# Patient Record
Sex: Female | Born: 1940 | Race: White | Hispanic: No | Marital: Married | Smoking: Never smoker
Health system: Southern US, Community
[De-identification: ages and names within clinical notes are randomized; demographics above are authoritative.]

## PROBLEM LIST (undated history)

## (undated) DIAGNOSIS — K439 Ventral hernia without obstruction or gangrene: Secondary | ICD-10-CM

## (undated) DIAGNOSIS — C801 Malignant (primary) neoplasm, unspecified: Secondary | ICD-10-CM

## (undated) DIAGNOSIS — M1990 Unspecified osteoarthritis, unspecified site: Secondary | ICD-10-CM

## (undated) HISTORY — PX: BREAST LUMPECTOMY: SHX2

## (undated) HISTORY — DX: Ventral hernia without obstruction or gangrene: K43.9

---
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
## 1971-08-31 HISTORY — PX: BREAST BIOPSY: SHX20

## 1992-06-01 HISTORY — PX: ABDOMINAL HYSTERECTOMY: SHX81

## 2011-02-27 DIAGNOSIS — Z8719 Personal history of other diseases of the digestive system: Secondary | ICD-10-CM | POA: Insufficient documentation

## 2012-04-19 ENCOUNTER — Ambulatory Visit: Payer: Self-pay | Admitting: Family Medicine

## 2013-04-20 ENCOUNTER — Ambulatory Visit: Payer: Self-pay | Admitting: Family Medicine

## 2013-12-26 DIAGNOSIS — M189 Osteoarthritis of first carpometacarpal joint, unspecified: Secondary | ICD-10-CM | POA: Insufficient documentation

## 2014-04-24 ENCOUNTER — Ambulatory Visit: Payer: Self-pay | Admitting: Family Medicine

## 2014-06-01 HISTORY — PX: TOTAL KNEE ARTHROPLASTY: SHX125

## 2014-11-22 DIAGNOSIS — R29810 Facial weakness: Secondary | ICD-10-CM | POA: Insufficient documentation

## 2014-12-28 DIAGNOSIS — Z96651 Presence of right artificial knee joint: Secondary | ICD-10-CM | POA: Insufficient documentation

## 2015-05-01 ENCOUNTER — Other Ambulatory Visit: Payer: Self-pay | Admitting: Family Medicine

## 2015-05-01 DIAGNOSIS — Z1231 Encounter for screening mammogram for malignant neoplasm of breast: Secondary | ICD-10-CM

## 2015-05-03 DIAGNOSIS — R7303 Prediabetes: Secondary | ICD-10-CM | POA: Insufficient documentation

## 2015-05-03 DIAGNOSIS — R739 Hyperglycemia, unspecified: Secondary | ICD-10-CM | POA: Insufficient documentation

## 2015-05-08 ENCOUNTER — Ambulatory Visit
Admission: RE | Admit: 2015-05-08 | Discharge: 2015-05-08 | Disposition: A | Discharge status: Home / Self Care | Payer: PPO | Source: Ambulatory Visit | Attending: Family Medicine | Admitting: Family Medicine

## 2015-05-08 DIAGNOSIS — Z1231 Encounter for screening mammogram for malignant neoplasm of breast: Secondary | ICD-10-CM | POA: Diagnosis present

## 2015-07-04 DIAGNOSIS — M189 Osteoarthritis of first carpometacarpal joint, unspecified: Secondary | ICD-10-CM | POA: Diagnosis not present

## 2015-07-04 DIAGNOSIS — Z1211 Encounter for screening for malignant neoplasm of colon: Secondary | ICD-10-CM | POA: Diagnosis not present

## 2015-07-04 DIAGNOSIS — R1310 Dysphagia, unspecified: Secondary | ICD-10-CM | POA: Diagnosis not present

## 2015-07-18 DIAGNOSIS — R1310 Dysphagia, unspecified: Secondary | ICD-10-CM | POA: Diagnosis not present

## 2015-07-18 DIAGNOSIS — K219 Gastro-esophageal reflux disease without esophagitis: Secondary | ICD-10-CM | POA: Diagnosis not present

## 2015-07-18 DIAGNOSIS — K449 Diaphragmatic hernia without obstruction or gangrene: Secondary | ICD-10-CM | POA: Diagnosis not present

## 2015-07-23 DIAGNOSIS — Z1283 Encounter for screening for malignant neoplasm of skin: Secondary | ICD-10-CM | POA: Diagnosis not present

## 2015-07-23 DIAGNOSIS — Z872 Personal history of diseases of the skin and subcutaneous tissue: Secondary | ICD-10-CM | POA: Diagnosis not present

## 2015-07-23 DIAGNOSIS — L298 Other pruritus: Secondary | ICD-10-CM | POA: Diagnosis not present

## 2015-07-23 DIAGNOSIS — B351 Tinea unguium: Secondary | ICD-10-CM | POA: Diagnosis not present

## 2015-07-23 DIAGNOSIS — D1722 Benign lipomatous neoplasm of skin and subcutaneous tissue of left arm: Secondary | ICD-10-CM | POA: Diagnosis not present

## 2015-07-23 DIAGNOSIS — L918 Other hypertrophic disorders of the skin: Secondary | ICD-10-CM | POA: Diagnosis not present

## 2015-07-23 DIAGNOSIS — L538 Other specified erythematous conditions: Secondary | ICD-10-CM | POA: Diagnosis not present

## 2015-07-23 DIAGNOSIS — R208 Other disturbances of skin sensation: Secondary | ICD-10-CM | POA: Diagnosis not present

## 2015-07-23 DIAGNOSIS — L578 Other skin changes due to chronic exposure to nonionizing radiation: Secondary | ICD-10-CM | POA: Diagnosis not present

## 2015-07-23 DIAGNOSIS — Z789 Other specified health status: Secondary | ICD-10-CM | POA: Diagnosis not present

## 2015-09-30 HISTORY — PX: COLONOSCOPY W/ BIOPSIES: SHX1374

## 2015-10-02 DIAGNOSIS — K640 First degree hemorrhoids: Secondary | ICD-10-CM | POA: Diagnosis not present

## 2015-10-02 DIAGNOSIS — K635 Polyp of colon: Secondary | ICD-10-CM | POA: Diagnosis not present

## 2015-10-02 DIAGNOSIS — Z980 Intestinal bypass and anastomosis status: Secondary | ICD-10-CM | POA: Diagnosis not present

## 2015-10-02 DIAGNOSIS — Z1211 Encounter for screening for malignant neoplasm of colon: Secondary | ICD-10-CM | POA: Diagnosis not present

## 2015-10-02 DIAGNOSIS — D127 Benign neoplasm of rectosigmoid junction: Secondary | ICD-10-CM | POA: Diagnosis not present

## 2015-11-22 DIAGNOSIS — G8929 Other chronic pain: Secondary | ICD-10-CM | POA: Diagnosis not present

## 2015-11-22 DIAGNOSIS — Z96652 Presence of left artificial knee joint: Secondary | ICD-10-CM | POA: Insufficient documentation

## 2015-11-22 DIAGNOSIS — Z96651 Presence of right artificial knee joint: Secondary | ICD-10-CM | POA: Diagnosis not present

## 2015-11-22 DIAGNOSIS — M25561 Pain in right knee: Secondary | ICD-10-CM | POA: Diagnosis not present

## 2015-11-22 DIAGNOSIS — Z09 Encounter for follow-up examination after completed treatment for conditions other than malignant neoplasm: Secondary | ICD-10-CM | POA: Diagnosis not present

## 2015-12-12 DIAGNOSIS — Z780 Asymptomatic menopausal state: Secondary | ICD-10-CM | POA: Diagnosis not present

## 2015-12-12 DIAGNOSIS — R739 Hyperglycemia, unspecified: Secondary | ICD-10-CM | POA: Diagnosis not present

## 2015-12-23 ENCOUNTER — Other Ambulatory Visit: Payer: Self-pay | Admitting: Family Medicine

## 2015-12-23 DIAGNOSIS — Z780 Asymptomatic menopausal state: Secondary | ICD-10-CM

## 2015-12-24 ENCOUNTER — Other Ambulatory Visit: Payer: Self-pay | Admitting: Family Medicine

## 2015-12-24 DIAGNOSIS — Z1231 Encounter for screening mammogram for malignant neoplasm of breast: Secondary | ICD-10-CM

## 2016-01-01 ENCOUNTER — Ambulatory Visit
Admission: RE | Admit: 2016-01-01 | Discharge: 2016-01-01 | Disposition: A | Discharge status: Home / Self Care | Payer: PPO | Source: Ambulatory Visit | Attending: Family Medicine | Admitting: Family Medicine

## 2016-01-01 DIAGNOSIS — Z780 Asymptomatic menopausal state: Secondary | ICD-10-CM | POA: Insufficient documentation

## 2016-01-01 DIAGNOSIS — M85852 Other specified disorders of bone density and structure, left thigh: Secondary | ICD-10-CM | POA: Diagnosis not present

## 2016-01-01 DIAGNOSIS — M8588 Other specified disorders of bone density and structure, other site: Secondary | ICD-10-CM | POA: Diagnosis not present

## 2016-02-10 DIAGNOSIS — Z23 Encounter for immunization: Secondary | ICD-10-CM | POA: Diagnosis not present

## 2016-04-17 ENCOUNTER — Ambulatory Visit
Admission: EM | Admit: 2016-04-17 | Discharge: 2016-04-17 | Disposition: A | Discharge status: Home / Self Care | Payer: PPO | Attending: Family Medicine | Admitting: Family Medicine

## 2016-04-17 ENCOUNTER — Ambulatory Visit (INDEPENDENT_AMBULATORY_CARE_PROVIDER_SITE_OTHER): Payer: PPO

## 2016-04-17 ENCOUNTER — Encounter: Payer: Self-pay | Admitting: Emergency Medicine

## 2016-04-17 DIAGNOSIS — W19XXXA Unspecified fall, initial encounter: Secondary | ICD-10-CM | POA: Diagnosis not present

## 2016-04-17 DIAGNOSIS — S40022A Contusion of left upper arm, initial encounter: Secondary | ICD-10-CM | POA: Diagnosis not present

## 2016-04-17 HISTORY — DX: Unspecified osteoarthritis, unspecified site: M19.90

## 2016-04-17 NOTE — ED Triage Notes (Signed)
Patient states that she fell and hit her left upper arm on the chest of drawers today.

## 2016-04-17 NOTE — ED Provider Notes (Signed)
CSN: [id number]     Arrival date & time 04/17/16  1429 History   None    Chief Complaint  Patient presents with  . Arm Pain    left  . Fall   (Consider location/radiation/quality/duration/timing/severity/associated sxs/prior Treatment) HPI  75 year old female presents with an injury to her left lateral upper arm after she tripped This morning and fell against the edge of the chest of drawers. Since that time she's had some pain over right Mid humerus with  obvious bruise and hematoma. She has had applied ice 20 minutes on 20 minutes off today.       Past Medical History:  Diagnosis Date  . Arthritis    Past Surgical History:  Procedure Laterality Date  . ABDOMINAL HYSTERECTOMY    . BREAST BIOPSY Left 08/31/1971   neg   Family History  Problem Relation Age of Onset  . Breast cancer Maternal Aunt 70  . CAD Mother   . Cancer Father    Social History  Substance Use Topics  . Smoking status: Never Smoker  . Smokeless tobacco: Never Used  . Alcohol use No   OB History    No data available     Review of Systems  Constitutional: Positive for activity change. Negative for appetite change, chills, fatigue and fever.  Musculoskeletal: Positive for myalgias.  All other systems reviewed and are negative.   Allergies  Patient has no known allergies.  Home Medications   Prior to Admission medications   Medication Sig Start Date End Date Taking? Authorizing Provider  celecoxib (CELEBREX) 50 MG capsule Take 100 mg by mouth 2 (two) times daily.   Yes Historical Provider, MD  Multiple Vitamin (MULTIVITAMIN) tablet Take 1 tablet by mouth daily.   Yes Historical Provider, MD  traMADol (ULTRAM) 50 MG tablet Take 50 mg by mouth every 6 (six) hours as needed.   Yes Historical Provider, MD   Meds Ordered and Administered this Visit  Medications - No data to display  BP (!) 149/71 (BP Location: Right Arm)   Pulse 68   Temp 97.5 F (36.4 C) (Tympanic)   Resp 16   Ht 5\' 5"   (1.651 m)   Wt 175 lb (79.4 kg)   SpO2 97%   BMI 29.12 kg/m  No data found.   Physical Exam  Constitutional: She is oriented to person, place, and time. She appears well-developed and well-nourished. No distress.  HENT:  Head: Normocephalic and atraumatic.  Eyes: EOM are normal. Pupils are equal, round, and reactive to light.  Neck: Normal range of motion. Neck supple.  Musculoskeletal: Normal range of motion. She exhibits edema and tenderness.  Examination of the left upper arm shows a bruising and hematoma over the mid portion. Denies present under the hematoma bruise and also some tenderness along the humerus particularly posterior and lateral. Elbow range of motion is full and comfortable.  Neurological: She is alert and oriented to person, place, and time.  Skin: Skin is warm and dry. She is not diaphoretic.  Psychiatric: She has a normal mood and affect. Her behavior is normal. Judgment and thought content normal.  Nursing note and vitals reviewed.   Urgent Care Course   Clinical Course     Procedures (including critical care time)  Labs Review Labs Reviewed - No data to display  Imaging Review Dg Humerus Left  Result Date: 04/17/2016 CLINICAL DATA:  Initial encounter for PT hit mid shaft of her left humerus against a cupboard today now with  bruising and pain. No hx of previous injury or trauma. EXAM: LEFT HUMERUS - 2+ VIEW COMPARISON:  None. FINDINGS: No acute fracture or dislocation. No focal osseous lesion. No definite soft tissue swelling. IMPRESSION: No acute osseous abnormality. Electronically Signed   By: Abigail Miyamoto M.D.   On: 04/17/2016 17:11     Visual Acuity Review  Right Eye Distance:   Left Eye Distance:   Bilateral Distance:    Right Eye Near:   Left Eye Near:    Bilateral Near:         MDM   1. Fall, initial encounter   2. Contusion of left arm, initial encounter    Discussed with the patient and her husband our findings today. Reviewed  the x-rays with them. Has a contusion of the mid humerus. I told her to expect bruising and actually some extravasation of the Bruce into her forearm and hand is possible. I have recommended that she use ice 20 minutes out of every 2 hours while awake. Otherwise she should heal without incident. If she has any problems she will follow-up with her primary care physician    Lorin Picket, PA-C 04/17/16 1748

## 2016-04-20 ENCOUNTER — Telehealth: Payer: Self-pay

## 2016-04-20 NOTE — Telephone Encounter (Signed)
Courtesy call back completed today after patient's visit at [facility]. Patient improved and will call back with any questions or concerns.  

## 2016-05-08 DIAGNOSIS — Z0000 Encounter for general adult medical examination without abnormal findings: Secondary | ICD-10-CM | POA: Diagnosis not present

## 2016-05-08 DIAGNOSIS — Z79899 Other long term (current) drug therapy: Secondary | ICD-10-CM | POA: Diagnosis not present

## 2016-05-08 DIAGNOSIS — R739 Hyperglycemia, unspecified: Secondary | ICD-10-CM | POA: Diagnosis not present

## 2016-05-11 ENCOUNTER — Ambulatory Visit
Admission: RE | Admit: 2016-05-11 | Discharge: 2016-05-11 | Disposition: A | Discharge status: Home / Self Care | Payer: PPO | Source: Ambulatory Visit | Attending: Family Medicine | Admitting: Family Medicine

## 2016-05-11 DIAGNOSIS — Z1231 Encounter for screening mammogram for malignant neoplasm of breast: Secondary | ICD-10-CM | POA: Diagnosis not present

## 2016-05-30 ENCOUNTER — Encounter: Payer: Self-pay | Admitting: Internal Medicine

## 2016-05-30 ENCOUNTER — Other Ambulatory Visit: Payer: Self-pay | Admitting: Internal Medicine

## 2016-06-05 ENCOUNTER — Ambulatory Visit: Payer: Self-pay | Admitting: Internal Medicine

## 2016-06-26 DIAGNOSIS — H2513 Age-related nuclear cataract, bilateral: Secondary | ICD-10-CM | POA: Diagnosis not present

## 2016-08-12 ENCOUNTER — Encounter: Payer: Self-pay | Admitting: Family Medicine

## 2016-08-12 ENCOUNTER — Ambulatory Visit: Payer: Self-pay | Admitting: Family Medicine

## 2016-08-12 ENCOUNTER — Ambulatory Visit (INDEPENDENT_AMBULATORY_CARE_PROVIDER_SITE_OTHER): Payer: PPO | Admitting: Family Medicine

## 2016-08-12 VITALS — BP 117/76 | HR 96 | Resp 16 | Ht 65.0 in | Wt 173.0 lb

## 2016-08-12 DIAGNOSIS — E663 Overweight: Secondary | ICD-10-CM

## 2016-08-12 DIAGNOSIS — N6019 Diffuse cystic mastopathy of unspecified breast: Secondary | ICD-10-CM

## 2016-08-12 DIAGNOSIS — K449 Diaphragmatic hernia without obstruction or gangrene: Secondary | ICD-10-CM

## 2016-08-12 DIAGNOSIS — M8580 Other specified disorders of bone density and structure, unspecified site: Secondary | ICD-10-CM

## 2016-08-12 DIAGNOSIS — E559 Vitamin D deficiency, unspecified: Secondary | ICD-10-CM | POA: Diagnosis not present

## 2016-08-12 DIAGNOSIS — Z87898 Personal history of other specified conditions: Secondary | ICD-10-CM | POA: Insufficient documentation

## 2016-08-12 DIAGNOSIS — R739 Hyperglycemia, unspecified: Secondary | ICD-10-CM | POA: Diagnosis not present

## 2016-08-12 DIAGNOSIS — E538 Deficiency of other specified B group vitamins: Secondary | ICD-10-CM

## 2016-08-12 DIAGNOSIS — L814 Other melanin hyperpigmentation: Secondary | ICD-10-CM | POA: Diagnosis not present

## 2016-08-12 DIAGNOSIS — Z8719 Personal history of other diseases of the digestive system: Secondary | ICD-10-CM | POA: Diagnosis not present

## 2016-08-12 DIAGNOSIS — M180 Bilateral primary osteoarthritis of first carpometacarpal joints: Secondary | ICD-10-CM

## 2016-08-12 DIAGNOSIS — L218 Other seborrheic dermatitis: Secondary | ICD-10-CM | POA: Diagnosis not present

## 2016-08-12 MED ORDER — ACETAMINOPHEN 500 MG PO TABS: 1000.0000 mg | ORAL_TABLET | Freq: Two times a day (BID) | ORAL | 0 refills | Status: AC

## 2016-08-12 NOTE — Patient Instructions (Addendum)
Stop celecoxib, begin Tylenol ES 2 tablets twice daily.

## 2016-08-13 ENCOUNTER — Other Ambulatory Visit: Payer: Self-pay | Admitting: Family Medicine

## 2016-08-13 DIAGNOSIS — M8580 Other specified disorders of bone density and structure, unspecified site: Secondary | ICD-10-CM | POA: Insufficient documentation

## 2016-08-13 DIAGNOSIS — K449 Diaphragmatic hernia without obstruction or gangrene: Secondary | ICD-10-CM | POA: Insufficient documentation

## 2016-08-13 DIAGNOSIS — N6019 Diffuse cystic mastopathy of unspecified breast: Secondary | ICD-10-CM | POA: Insufficient documentation

## 2016-08-13 LAB — CBC
HEMOGLOBIN: 13.5 g/dL (ref 11.1–15.9)
Hematocrit: 41.1 % (ref 34.0–46.6)
MCH: 30.3 pg (ref 26.6–33.0)
MCHC: 32.8 g/dL (ref 31.5–35.7)
MCV: 92 fL (ref 79–97)
Platelets: 332 10*3/uL (ref 150–379)
RBC: 4.45 x10E6/uL (ref 3.77–5.28)
RDW: 14.4 % (ref 12.3–15.4)
WBC: 8.9 10*3/uL (ref 3.4–10.8)

## 2016-08-13 LAB — COMPREHENSIVE METABOLIC PANEL
ALT: 27 IU/L (ref 0–32)
AST: 28 IU/L (ref 0–40)
Albumin/Globulin Ratio: 1.8 (ref 1.2–2.2)
Albumin: 4.6 g/dL (ref 3.5–4.8)
Alkaline Phosphatase: 72 IU/L (ref 39–117)
BUN/Creatinine Ratio: 22 (ref 12–28)
BUN: 18 mg/dL (ref 8–27)
Bilirubin Total: 0.3 mg/dL (ref 0.0–1.2)
CALCIUM: 10 mg/dL (ref 8.7–10.3)
CO2: 25 mmol/L (ref 18–29)
CREATININE: 0.82 mg/dL (ref 0.57–1.00)
Chloride: 100 mmol/L (ref 96–106)
GFR calc Af Amer: 82 mL/min/{1.73_m2} (ref >59)
GFR, EST NON AFRICAN AMERICAN: 71 mL/min/{1.73_m2} (ref >59)
Globulin, Total: 2.5 g/dL (ref 1.5–4.5)
Glucose: 100 mg/dL — ABNORMAL HIGH (ref 65–99)
Potassium: 4.8 mmol/L (ref 3.5–5.2)
Sodium: 141 mmol/L (ref 134–144)
Total Protein: 7.1 g/dL (ref 6.0–8.5)

## 2016-08-13 LAB — HEMOGLOBIN A1C
Est. average glucose Bld gHb Est-mCnc: 111 mg/dL
Hgb A1c MFr Bld: 5.5 % (ref 4.8–5.6)

## 2016-08-13 LAB — TSH: TSH: 1.39 u[IU]/mL (ref 0.450–4.500)

## 2016-08-13 LAB — VITAMIN B12

## 2016-08-13 LAB — VITAMIN D 25 HYDROXY (VIT D DEFICIENCY, FRACTURES): VIT D 25 HYDROXY: 31.3 ng/mL (ref 30.0–100.0)

## 2016-08-13 NOTE — Progress Notes (Signed)
Date:  08/12/2016   Name:  Felicia Romero   DOB:  1940/10/03   MRN:  [id number]  PCP:  Adline Potter, MD    Chief Complaint: Establish Care and Sciatica (Right side sciatica like pain )   History of Present Illness:  This is a 76 y.o. female seen for initial visit. Hx OA s/p B TKR, c/o B basal thumb pain improved with Voltaren gel. On Celebrex since before TKR, takes qd-bid, has not tried to stop. Hx osteopenia on DEXA 12/2015. Hx diverticulitis and gastric ulcer, colonoscopy 09/2015 normal. Barium swallow 07/2015 showed HH, gets occ dysphagia, not interested in surgery. Weight stable, saw optho 3 months ago, told prediabetic in past. Takes multiple vits/supps not sure she needs them all. Takes amox prn dental procedures. Father 45 with dementia, prostate ca, mother died 15 CVA, brother with psoriatic arthritis. Mammo 05/2016 ok. Imms UTD, had zoster imm in 2013.  Review of Systems:  Review of Systems  Constitutional: Negative for chills, fever and unexpected weight change.  HENT: Negative for ear pain and sore throat.   Eyes: Negative for pain.  Respiratory: Negative for cough and shortness of breath.   Cardiovascular: Negative for chest pain and leg swelling.  Gastrointestinal: Negative for abdominal pain.  Endocrine: Negative for polydipsia and polyuria.  Genitourinary: Negative for dysuria.  Neurological: Negative for tremors, syncope and light-headedness.    Patient Active Problem List   Diagnosis Date Noted  . Osteopenia 08/13/2016  . Hiatal hernia 08/13/2016  . Fibrocystic breast disease 08/13/2016  . History of prediabetes 08/12/2016  . Hx of diverticulitis of colon 08/12/2016  . Vitamin D deficiency 08/12/2016  . B12 deficiency 08/12/2016  . 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 Hyperglycemia, unspecified 05/03/2015  . Presence of right artificial knee joint 12/28/2014  . Facial droop 11/22/2014  . CMC arthritis, thumb, 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   . History of GI bleed 02/27/2011    Prior to Admission medications   Medication Sig Start Date End Date Taking? Authorizing Provider  amoxicillin (AMOXIL) 500 MG tablet Take 500 mg by mouth 2 (two) times daily.   Yes Historical Provider, MD  Ascorbic Acid (VITAMIN C) 1000 MG tablet Take 1,000 mg by mouth daily.   Yes Historical Provider, MD  B Complex-C (SUPER B COMPLEX PO) Take by mouth.   Yes Historical Provider, MD  Biotin 5000 MCG TABS Take by mouth.   Yes Historical Provider, MD  Calcium Carbonate-Vit D-Min (CALCIUM 1200 PO) Take by mouth.   Yes Historical Provider, MD  Cyanocobalamin (VITAMIN B 12 PO) Take 1,000 mg by mouth.   Yes Historical Provider, MD  desonide (DESOWEN) 0.05 % cream Apply topically. 06/25/16 05/09/17 Yes Historical Provider, MD  diclofenac sodium (VOLTAREN) 1 % GEL Apply to affected area every 8 hours as needed 06/25/16 05/08/17 Yes Historical Provider, MD  magnesium oxide (MAG-OX) 400 MG tablet Take 400 mg by mouth daily.   Yes Historical Provider, MD  Multiple Vitamin (MULTIVITAMIN) tablet Take 1 tablet by mouth daily.   Yes Historical Provider, MD  VITAMIN D, CHOLECALCIFEROL, PO Take by mouth.   Yes Historical Provider, MD  Zinc Sulfate (ZINC 15 PO) Take by mouth.   Yes Historical Provider, MD  acetaminophen (TYLENOL) 500 MG tablet Take 2 tablets (1,000 mg total) by mouth 2 (two) times daily. 08/12/16   Adline Potter, MD    No Known Allergies  Past Surgical History:  Procedure Laterality Date  . ABDOMINAL HYSTERECTOMY  1994  .  BREAST BIOPSY Left 08/31/1971   neg  . COLONOSCOPY W/ BIOPSIES  09/2015  . TONSILLECTOMY  1950  . TOTAL KNEE ARTHROPLASTY Bilateral 2016    Social History  Substance Use Topics  . Smoking status: Never Smoker  . Smokeless tobacco: Never Used  . Alcohol use No    Family History  Problem Relation Age of Onset  . Breast cancer Maternal Aunt 70  . CAD Mother   . Stroke Mother   . Prostate cancer Father   . Thyroid cancer  Daughter     Medication list has been reviewed and updated.  Physical Examination: BP 117/76   Pulse 96   Resp 16   Ht 5\' 5"  (1.651 m)   Wt 173 lb (78.5 kg)   SpO2 98%   BMI 28.79 kg/m   Physical Exam  Constitutional: She is oriented to person, place, and time. She appears well-developed and well-nourished.  HENT:  Head: Normocephalic and atraumatic.  Right Ear: External ear normal.  Left Ear: External ear normal.  Nose: Nose normal.  Mouth/Throat: Oropharynx is clear and moist.  TMs clear  Eyes: Conjunctivae and EOM are normal. Pupils are equal, round, and reactive to light. No scleral icterus.  Neck: Normal range of motion. Neck supple. No thyromegaly present.  Cardiovascular: Normal rate, regular rhythm, normal heart sounds and intact distal pulses.   Pulmonary/Chest: Effort normal and breath sounds normal.  Abdominal: Soft. She exhibits no distension and no mass. There is no tenderness.  Musculoskeletal: She exhibits no edema.  Lymphadenopathy:    She has no cervical adenopathy.  Neurological: She is alert and oriented to person, place, and time. Coordination normal.  Romberg negative, gait normal  Skin: Skin is warm and dry.  Psychiatric: She has a normal mood and affect. Her behavior is normal.  Nursing note and vitals reviewed.   Assessment and Plan:  1. Primary osteoarthritis of both first carpometacarpal joints Recommend d/c Celebrex given hx gastric ulcer, trial Tylenol 1000 mg bid, cont Voltaren gel prn - Comprehensive Metabolic Panel (CMET) - CBC  2. Hyperglycemia, unspecified Possible prediabetes in past - HgB A1c  3. Osteopenia, unspecified location Discussed weight bearing exercise, vit D   4. Overweight (BMI 25.0-29.9) Discussed exercise 30 mins/d, weight loss - TSH  5. Hiatal hernia With dysphagia, declined GI referral unless sxs worsen  6. Fibrocystic breast disease (FCBD), unspecified laterality Mammo q56yr  7. Vitamin D deficiency On  supplement - Vitamin D (25 hydroxy)  8. B12 deficiency On supplement - B12  9. Hx of diverticulitis of colon  10. History of GI bleed  Return in about 4 weeks (around 09/09/2016).   45 minutes spent with pt over half in counseling  Joretta Eads M. [facility]  08/13/2016

## 2016-09-10 ENCOUNTER — Ambulatory Visit (INDEPENDENT_AMBULATORY_CARE_PROVIDER_SITE_OTHER): Payer: PPO | Admitting: Family Medicine

## 2016-09-10 ENCOUNTER — Encounter: Payer: Self-pay | Admitting: Family Medicine

## 2016-09-10 VITALS — BP 142/82 | HR 67 | Resp 16 | Ht 65.0 in | Wt 173.0 lb

## 2016-09-10 DIAGNOSIS — K449 Diaphragmatic hernia without obstruction or gangrene: Secondary | ICD-10-CM

## 2016-09-10 DIAGNOSIS — M25551 Pain in right hip: Secondary | ICD-10-CM

## 2016-09-10 DIAGNOSIS — Z8489 Family history of other specified conditions: Secondary | ICD-10-CM

## 2016-09-10 DIAGNOSIS — M8580 Other specified disorders of bone density and structure, unspecified site: Secondary | ICD-10-CM | POA: Diagnosis not present

## 2016-09-10 DIAGNOSIS — E663 Overweight: Secondary | ICD-10-CM

## 2016-09-10 NOTE — Progress Notes (Signed)
Date:  09/10/2016   Name:  Felicia Romero   DOB:  January 31, 1941   MRN:  [id number]  PCP:  Adline Potter, MD    Chief Complaint: Osteoarthritis (4 week follow up- Who can you recommend for Hip Replacement? )   History of Present Illness:  This is a 76 y.o. female seen for one month f/u. Having intermittent R hip pain and thinks may need THR (has had both knees done), requests local ortho referral. Stopped daily Celebrex, using prn only. Cut back on vitamins, still on mvit, biotin, ca, C, and D. Blood work last visit showed no prediabetes, high B12 level. Occ dysphagia when eats too quickly. Concerned about husband's MCI.  Review of Systems:  Review of Systems  Constitutional: Negative for chills and fever.  Respiratory: Negative for cough and shortness of breath.   Cardiovascular: Negative for chest pain and leg swelling.  Endocrine: Negative for polydipsia and polyuria.  Genitourinary: Negative for dysuria.  Neurological: Negative for syncope and light-headedness.    Patient Active Problem List   Diagnosis Date Noted  . Overweight (BMI 25.0-29.9) 09/10/2016  . Osteopenia 08/13/2016  . Hiatal hernia 08/13/2016  . Fibrocystic breast disease 08/13/2016  . History of prediabetes 08/12/2016  . Hx of diverticulitis of colon 08/12/2016  . Vitamin D deficiency 08/12/2016  . B12 deficiency 08/12/2016  . 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 Hyperglycemia, unspecified 05/03/2015  . Presence of right artificial knee joint 12/28/2014  . Facial droop 11/22/2014  . 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 History of GI bleed 02/27/2011    Prior to Admission medications   Medication Sig Start Date End Date Taking? Authorizing Provider  acetaminophen (TYLENOL) 500 MG tablet Take 2 tablets (1,000 mg total) by mouth 2 (two) times daily. 08/12/16  Yes Adline Potter, MD  Biotin 5000 MCG TABS Take by mouth.   Yes Historical Provider, MD  celecoxib (CELEBREX) 100  MG capsule Take 100 mg by mouth 2 (two) times daily as needed.   Yes Historical Provider, MD  diclofenac sodium (VOLTAREN) 1 % GEL Apply to affected area every 8 hours as needed 06/25/16 05/08/17 Yes Historical Provider, MD  ketoconazole (NIZORAL) 2 % cream Apply 1 application topically daily.   Yes Historical Provider, MD  Multiple Vitamin (MULTIVITAMIN) tablet Take 1 tablet by mouth daily.   Yes Historical Provider, MD  VITAMIN D, CHOLECALCIFEROL, PO Take 1,000 Units by mouth 2 (two) times daily.   Yes Historical Provider, MD    No Known Allergies  Past Surgical History:  Procedure Laterality Date  . ABDOMINAL HYSTERECTOMY  1994  . BREAST BIOPSY Left 08/31/1971   neg  . COLONOSCOPY W/ BIOPSIES  09/2015  . TONSILLECTOMY  1950  . TOTAL KNEE ARTHROPLASTY Bilateral 2016    Social History  Substance Use Topics  . Smoking status: Never Smoker  . Smokeless tobacco: Never Used  . Alcohol use No    Family History  Problem Relation Age of Onset  . Breast cancer Maternal Aunt 70  . CAD Mother   . Stroke Mother   . Prostate cancer Father   . Thyroid cancer Daughter     Medication list has been reviewed and updated.  Physical Examination: BP (!) 142/82   Pulse 67   Resp 16   Ht 5\' 5"  (1.651 m)   Wt 173 lb (78.5 kg)   SpO2 98%   BMI 28.79 kg/m   Physical Exam  Constitutional: She appears well-developed and well-nourished.  Cardiovascular: Normal rate, regular rhythm and normal heart sounds.   Pulmonary/Chest: Effort normal and breath sounds normal.  Musculoskeletal: She exhibits no edema.  Neurological: She is alert.  Skin: Skin is warm and dry.  Psychiatric: She has a normal mood and affect. Her behavior is normal.  Nursing note and vitals reviewed.   Assessment and Plan:  1. Right hip pain, intermittent Likely OA, cont Tylenol/Celebrex prn only, recommend THR as last resort - Ambulatory referral to Orthopedic Surgery  2. Hiatal hernia Discussed sx avoidance  3.  Osteopenia, unspecified location Cont vit D, may d/c calcium supplement  4. Overweight (BMI 25.0-29.9) Exercise, weight loss discussed  5. Family health problem Discussed MCI management  6. Med review Cont biotin/mvit but d/c vit C as no clear indication for use, NYT article on supplements given  Return in about 6 months (around 03/12/2017).  Satira Anis. [facility]  09/10/2016

## 2016-09-15 DIAGNOSIS — M169 Osteoarthritis of hip, unspecified: Secondary | ICD-10-CM | POA: Insufficient documentation

## 2016-09-15 DIAGNOSIS — M1611 Unilateral primary osteoarthritis, right hip: Secondary | ICD-10-CM | POA: Diagnosis not present

## 2017-02-08 DIAGNOSIS — M25551 Pain in right hip: Secondary | ICD-10-CM | POA: Diagnosis not present

## 2017-02-08 DIAGNOSIS — M1611 Unilateral primary osteoarthritis, right hip: Secondary | ICD-10-CM | POA: Diagnosis not present

## 2017-02-17 ENCOUNTER — Ambulatory Visit (INDEPENDENT_AMBULATORY_CARE_PROVIDER_SITE_OTHER): Payer: PPO

## 2017-02-17 DIAGNOSIS — Z23 Encounter for immunization: Secondary | ICD-10-CM

## 2017-03-01 DIAGNOSIS — Z01818 Encounter for other preprocedural examination: Secondary | ICD-10-CM | POA: Diagnosis not present

## 2017-03-01 DIAGNOSIS — M1611 Unilateral primary osteoarthritis, right hip: Secondary | ICD-10-CM | POA: Diagnosis not present

## 2017-03-01 DIAGNOSIS — R7303 Prediabetes: Secondary | ICD-10-CM | POA: Diagnosis not present

## 2017-03-01 DIAGNOSIS — Z8719 Personal history of other diseases of the digestive system: Secondary | ICD-10-CM | POA: Diagnosis not present

## 2017-03-01 DIAGNOSIS — R29810 Facial weakness: Secondary | ICD-10-CM | POA: Diagnosis not present

## 2017-03-09 DIAGNOSIS — Z96653 Presence of artificial knee joint, bilateral: Secondary | ICD-10-CM | POA: Diagnosis not present

## 2017-03-09 DIAGNOSIS — M1611 Unilateral primary osteoarthritis, right hip: Secondary | ICD-10-CM | POA: Diagnosis not present

## 2017-03-09 DIAGNOSIS — Z471 Aftercare following joint replacement surgery: Secondary | ICD-10-CM | POA: Diagnosis not present

## 2017-03-09 DIAGNOSIS — R7303 Prediabetes: Secondary | ICD-10-CM | POA: Diagnosis not present

## 2017-03-09 DIAGNOSIS — Z96641 Presence of right artificial hip joint: Secondary | ICD-10-CM | POA: Diagnosis not present

## 2017-03-09 DIAGNOSIS — R001 Bradycardia, unspecified: Secondary | ICD-10-CM | POA: Diagnosis not present

## 2017-03-09 DIAGNOSIS — R29810 Facial weakness: Secondary | ICD-10-CM | POA: Diagnosis not present

## 2017-03-09 DIAGNOSIS — Z8719 Personal history of other diseases of the digestive system: Secondary | ICD-10-CM | POA: Diagnosis not present

## 2017-03-10 DIAGNOSIS — Z96641 Presence of right artificial hip joint: Secondary | ICD-10-CM | POA: Insufficient documentation

## 2017-03-12 ENCOUNTER — Ambulatory Visit: Payer: PPO | Admitting: Family Medicine

## 2017-03-12 ENCOUNTER — Other Ambulatory Visit: Payer: Self-pay

## 2017-03-12 NOTE — Patient Outreach (Signed)
[facility]) Care Management  03/12/2017  Felicia Romero 12/27/1940 [id number]   Referral Date: 03/11/17 Referral Source: HTA report Date of Admission: 03/09/17 Diagnosis: Right Hip replacement Date of Discharge: 03/10/17 Facility: [facility] Insurance: HTA  Outreach attempt # 1  Spoke with patient she reports she is doing great with her hip replacement and has support and help from her spouse.  She states her husband takes her to her appointments.  Patient doing outpatient therapy.  Patient to see surgeon and primary physician the week of 03-22-17.  Patient reports she has a dressing to her surgical site and that she is supposed to take the dressing off on Tuesday and she has all the supplies.  She states she is to take a picture of the site and send it to the physician.  Reviewed with patient signs of wound infection. She verbalized understanding. Patient using tylenol and oxycodone alternately for pain relief and states it is effective.  Patient know to call the surgeon for any problems.  Discussed with patient [place] patient denies any needs and CM did not identify any needs during the call.     Plan: RN CM will send letter and brochure for future reference.   RN CM will close case as no needs identified and notify care management assistant of case status  Felicia Baseman, RN, MSN [place] Management Telephonic Coordinator Direct Line [phone] Toll Free: [phone]  Fax: [phone]

## 2017-03-16 DIAGNOSIS — Z96641 Presence of right artificial hip joint: Secondary | ICD-10-CM | POA: Diagnosis not present

## 2017-03-16 DIAGNOSIS — M25651 Stiffness of right hip, not elsewhere classified: Secondary | ICD-10-CM | POA: Diagnosis not present

## 2017-03-16 DIAGNOSIS — M6281 Muscle weakness (generalized): Secondary | ICD-10-CM | POA: Diagnosis not present

## 2017-03-18 DIAGNOSIS — Z96641 Presence of right artificial hip joint: Secondary | ICD-10-CM | POA: Diagnosis not present

## 2017-03-18 DIAGNOSIS — M6281 Muscle weakness (generalized): Secondary | ICD-10-CM | POA: Diagnosis not present

## 2017-03-18 DIAGNOSIS — M25651 Stiffness of right hip, not elsewhere classified: Secondary | ICD-10-CM | POA: Diagnosis not present

## 2017-03-23 ENCOUNTER — Ambulatory Visit (INDEPENDENT_AMBULATORY_CARE_PROVIDER_SITE_OTHER): Payer: PPO | Admitting: Family Medicine

## 2017-03-23 ENCOUNTER — Encounter: Payer: Self-pay | Admitting: Family Medicine

## 2017-03-23 VITALS — BP 140/78 | HR 98 | Resp 16 | Ht 65.0 in | Wt 174.0 lb

## 2017-03-23 DIAGNOSIS — M6281 Muscle weakness (generalized): Secondary | ICD-10-CM | POA: Diagnosis not present

## 2017-03-23 DIAGNOSIS — M25651 Stiffness of right hip, not elsewhere classified: Secondary | ICD-10-CM | POA: Diagnosis not present

## 2017-03-23 DIAGNOSIS — R7303 Prediabetes: Secondary | ICD-10-CM | POA: Diagnosis not present

## 2017-03-23 DIAGNOSIS — Z96641 Presence of right artificial hip joint: Secondary | ICD-10-CM | POA: Diagnosis not present

## 2017-03-23 DIAGNOSIS — Z8719 Personal history of other diseases of the digestive system: Secondary | ICD-10-CM | POA: Diagnosis not present

## 2017-03-23 DIAGNOSIS — E663 Overweight: Secondary | ICD-10-CM | POA: Diagnosis not present

## 2017-03-23 DIAGNOSIS — E559 Vitamin D deficiency, unspecified: Secondary | ICD-10-CM

## 2017-03-23 NOTE — Progress Notes (Signed)
Date:  03/23/2017   Name:  Felicia Romero   DOB:  10-May-1941   MRN:  [id number]  PCP:  Adline Potter, MD    Chief Complaint: Osteoporosis (Post Hip replacement)   History of Present Illness:  This is a 76 y.o. female seen two s/p R THR, tolerated well, see see ortho back in two days. Sl anemia postop, a1c 5.8%. On gabapentin qhs and asa bid per ortho, using oxycodone before PT only, using Celebrex prn only. Prefers to stay on biotin and vit C, taking vit D supplement.  Review of Systems:  Review of Systems  Constitutional: Negative for chills and fever.  Respiratory: Negative for cough and shortness of breath.   Cardiovascular: Negative for chest pain.  Genitourinary: Negative for difficulty urinating and dysuria.  Neurological: Negative for syncope and light-headedness.    Patient Active Problem List   Diagnosis Date Noted  . 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 Overweight (BMI 25.0-29.9) 09/10/2016  . Osteopenia 08/13/2016  . Hiatal hernia 08/13/2016  . Fibrocystic breast disease 08/13/2016  . Hx of diverticulitis of colon 08/12/2016  . Vitamin D deficiency 08/12/2016  . B12 deficiency 08/12/2016  . 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 Hyperglycemia, unspecified 05/03/2015  . Prediabetes 05/03/2015  . Presence of right artificial knee joint 12/28/2014  . Facial droop 11/22/2014  . 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 History of GI bleed 02/27/2011    Prior to Admission medications   Medication Sig Start Date End Date Taking? Authorizing Provider  acetaminophen (TYLENOL) 500 MG tablet Take 2 tablets (1,000 mg total) by mouth 2 (two) times daily. 08/12/16  Yes Adriaan Maltese, Gwyndolyn Saxon, MD  Ascorbic Acid (VITAMIN C) 1000 MG tablet Take 1,000 mg by mouth daily.   Yes [provider]  Biotin 5000 MCG TABS Take 2,000 mcg by mouth.    Yes [provider]  celecoxib (CELEBREX) 100 MG capsule Take 100 mg by  mouth daily as needed.   Yes [provider]  gabapentin (NEURONTIN) 300 MG capsule Take 1 capsule by mouth at bedtime. 03/10/17 04/09/17 Yes [provider]  ketoconazole (NIZORAL) 2 % cream Apply 1 application topically daily.   Yes [provider]  Multiple Vitamin (MULTIVITAMIN) tablet Take 1 tablet by mouth daily.   Yes [provider]  oxyCODONE (OXY IR/ROXICODONE) 5 MG immediate release tablet Take 1 tablet by mouth every 6 (six) hours as needed. 03/10/17  Yes [provider]  VITAMIN D, CHOLECALCIFEROL, PO Take 1,000 Units by mouth daily.   Yes [provider]    No Known Allergies  Past Surgical History:  Procedure Laterality Date  . ABDOMINAL HYSTERECTOMY  1994  . BREAST BIOPSY Left 08/31/1971   neg  . COLONOSCOPY W/ BIOPSIES  09/2015  . TONSILLECTOMY  1950  . TOTAL KNEE ARTHROPLASTY Bilateral 2016    Social History  Substance Use Topics  . Smoking status: Never Smoker  . Smokeless tobacco: Never Used  . Alcohol use No    Family History  Problem Relation Age of Onset  . Breast cancer Maternal Aunt 70  . CAD Mother   . Stroke Mother   . Prostate cancer Father   . Thyroid cancer Daughter     Medication list has been reviewed and updated.  Physical Examination: BP 140/78   Pulse 98   Resp 16   Ht 5\' 5"  (1.651 m)   Wt 174 lb (78.9 kg)   SpO2  100%   BMI 28.96 kg/m   Physical Exam  Constitutional: She appears well-developed and well-nourished.  Cardiovascular: Normal rate, regular rhythm and normal heart sounds.   Pulmonary/Chest: Effort normal and breath sounds normal.  Musculoskeletal:  Trace RLE edema  Neurological: She is alert.  Skin: Skin is warm and dry.  Psychiatric: She has a normal mood and affect. Her behavior is normal.  Nursing note and vitals reviewed.   Assessment and Plan:  1. Status post total replacement of right hip Tolerated well, for ortho f/u this week  2.  Prediabetes Stable  3. Overweight (BMI 25.0-29.9) Exercise/weight loss discussed  4. Vitamin D deficiency Well controlled on supplement  5. History of GI bleed Advised d/c asa, use Celebrex sparingly  Return in about 6 months (around 09/21/2017).  Satira Anis. [facility]  03/23/2017

## 2017-03-24 ENCOUNTER — Other Ambulatory Visit: Payer: Self-pay | Admitting: Family Medicine

## 2017-03-24 DIAGNOSIS — Z1231 Encounter for screening mammogram for malignant neoplasm of breast: Secondary | ICD-10-CM

## 2017-03-25 DIAGNOSIS — M25651 Stiffness of right hip, not elsewhere classified: Secondary | ICD-10-CM | POA: Diagnosis not present

## 2017-03-25 DIAGNOSIS — M6281 Muscle weakness (generalized): Secondary | ICD-10-CM | POA: Diagnosis not present

## 2017-03-25 DIAGNOSIS — M25551 Pain in right hip: Secondary | ICD-10-CM | POA: Diagnosis not present

## 2017-03-25 DIAGNOSIS — Z96641 Presence of right artificial hip joint: Secondary | ICD-10-CM | POA: Diagnosis not present

## 2017-03-30 DIAGNOSIS — M25651 Stiffness of right hip, not elsewhere classified: Secondary | ICD-10-CM | POA: Diagnosis not present

## 2017-03-30 DIAGNOSIS — M6281 Muscle weakness (generalized): Secondary | ICD-10-CM | POA: Diagnosis not present

## 2017-03-30 DIAGNOSIS — Z96641 Presence of right artificial hip joint: Secondary | ICD-10-CM | POA: Diagnosis not present

## 2017-04-01 DIAGNOSIS — M25551 Pain in right hip: Secondary | ICD-10-CM | POA: Diagnosis not present

## 2017-04-01 DIAGNOSIS — Z96641 Presence of right artificial hip joint: Secondary | ICD-10-CM | POA: Diagnosis not present

## 2017-04-01 DIAGNOSIS — M25651 Stiffness of right hip, not elsewhere classified: Secondary | ICD-10-CM | POA: Diagnosis not present

## 2017-04-01 DIAGNOSIS — M6281 Muscle weakness (generalized): Secondary | ICD-10-CM | POA: Diagnosis not present

## 2017-04-20 DIAGNOSIS — M6281 Muscle weakness (generalized): Secondary | ICD-10-CM | POA: Diagnosis not present

## 2017-04-20 DIAGNOSIS — M25551 Pain in right hip: Secondary | ICD-10-CM | POA: Diagnosis not present

## 2017-04-20 DIAGNOSIS — M25651 Stiffness of right hip, not elsewhere classified: Secondary | ICD-10-CM | POA: Diagnosis not present

## 2017-04-20 DIAGNOSIS — Z96641 Presence of right artificial hip joint: Secondary | ICD-10-CM | POA: Diagnosis not present

## 2017-04-26 DIAGNOSIS — M25551 Pain in right hip: Secondary | ICD-10-CM | POA: Diagnosis not present

## 2017-04-27 DIAGNOSIS — Z96641 Presence of right artificial hip joint: Secondary | ICD-10-CM | POA: Diagnosis not present

## 2017-04-27 DIAGNOSIS — M6281 Muscle weakness (generalized): Secondary | ICD-10-CM | POA: Diagnosis not present

## 2017-04-29 DIAGNOSIS — M6281 Muscle weakness (generalized): Secondary | ICD-10-CM | POA: Diagnosis not present

## 2017-04-29 DIAGNOSIS — Z96641 Presence of right artificial hip joint: Secondary | ICD-10-CM | POA: Diagnosis not present

## 2017-05-12 ENCOUNTER — Ambulatory Visit
Admission: RE | Admit: 2017-05-12 | Discharge: 2017-05-12 | Disposition: A | Discharge status: Home / Self Care | Payer: PPO | Source: Ambulatory Visit | Attending: Family Medicine | Admitting: Family Medicine

## 2017-05-12 DIAGNOSIS — Z1231 Encounter for screening mammogram for malignant neoplasm of breast: Secondary | ICD-10-CM | POA: Diagnosis not present

## 2017-05-12 DIAGNOSIS — R928 Other abnormal and inconclusive findings on diagnostic imaging of breast: Secondary | ICD-10-CM | POA: Diagnosis not present

## 2017-05-13 ENCOUNTER — Other Ambulatory Visit: Payer: Self-pay | Admitting: Family Medicine

## 2017-05-13 ENCOUNTER — Telehealth: Payer: Self-pay

## 2017-05-13 DIAGNOSIS — R928 Other abnormal and inconclusive findings on diagnostic imaging of breast: Secondary | ICD-10-CM

## 2017-05-13 NOTE — Telephone Encounter (Signed)
OK - thanks

## 2017-05-13 NOTE — Telephone Encounter (Signed)
Patient called to say thank you for ordering the U/S so fast and wanted tot ell [place] it is scheduled next week at [facility]. She asked should she really wait and why will she have to wait this long to know why it was abnormal Mammo? I advised her many of these happen and it is precaution to review it and order additional testing. I calmed her down letting her know that often times it can be thickening or cysts or anything distorted and to not worry and that if doctor feels we need this sooner I will let her know. She asked if [facility] was better or where to go that is best. I advised stay at [facility].

## 2017-05-20 ENCOUNTER — Ambulatory Visit
Admission: RE | Admit: 2017-05-20 | Discharge: 2017-05-20 | Disposition: A | Discharge status: Home / Self Care | Payer: PPO | Source: Ambulatory Visit | Attending: Family Medicine | Admitting: Family Medicine

## 2017-05-20 DIAGNOSIS — R928 Other abnormal and inconclusive findings on diagnostic imaging of breast: Secondary | ICD-10-CM | POA: Insufficient documentation

## 2017-06-09 DIAGNOSIS — H2513 Age-related nuclear cataract, bilateral: Secondary | ICD-10-CM | POA: Diagnosis not present

## 2017-08-26 ENCOUNTER — Telehealth: Payer: Self-pay

## 2017-08-26 NOTE — Telephone Encounter (Signed)
Advised husband who is also our patient that Dr.Plonk is leaving the practice. I offered appointments with Otilio Miu and Dr.Berglund but husband prefers female so he and his wife will discuss what to do and call us back.

## 2017-09-28 ENCOUNTER — Ambulatory Visit: Payer: PPO | Admitting: Family Medicine

## 2017-10-22 DIAGNOSIS — M1990 Unspecified osteoarthritis, unspecified site: Secondary | ICD-10-CM | POA: Diagnosis not present

## 2017-10-22 DIAGNOSIS — K449 Diaphragmatic hernia without obstruction or gangrene: Secondary | ICD-10-CM | POA: Diagnosis not present

## 2018-01-25 ENCOUNTER — Other Ambulatory Visit: Payer: Self-pay | Admitting: Specialist

## 2018-01-25 DIAGNOSIS — Z1231 Encounter for screening mammogram for malignant neoplasm of breast: Secondary | ICD-10-CM

## 2018-01-25 DIAGNOSIS — M898X9 Other specified disorders of bone, unspecified site: Secondary | ICD-10-CM | POA: Diagnosis not present

## 2018-01-25 DIAGNOSIS — M8580 Other specified disorders of bone density and structure, unspecified site: Secondary | ICD-10-CM | POA: Diagnosis not present

## 2018-01-25 DIAGNOSIS — M8589 Other specified disorders of bone density and structure, multiple sites: Secondary | ICD-10-CM

## 2018-01-25 DIAGNOSIS — Z0000 Encounter for general adult medical examination without abnormal findings: Secondary | ICD-10-CM | POA: Diagnosis not present

## 2018-05-31 ENCOUNTER — Ambulatory Visit
Admission: RE | Admit: 2018-05-31 | Discharge: 2018-05-31 | Disposition: A | Discharge status: Home / Self Care | Payer: PPO | Source: Ambulatory Visit | Attending: Specialist | Admitting: Specialist

## 2018-05-31 DIAGNOSIS — M85852 Other specified disorders of bone density and structure, left thigh: Secondary | ICD-10-CM | POA: Diagnosis not present

## 2018-05-31 DIAGNOSIS — Z1231 Encounter for screening mammogram for malignant neoplasm of breast: Secondary | ICD-10-CM

## 2018-05-31 DIAGNOSIS — M8589 Other specified disorders of bone density and structure, multiple sites: Secondary | ICD-10-CM | POA: Insufficient documentation

## 2018-06-02 ENCOUNTER — Other Ambulatory Visit: Payer: Self-pay | Admitting: Specialist

## 2018-06-02 DIAGNOSIS — N6320 Unspecified lump in the left breast, unspecified quadrant: Secondary | ICD-10-CM

## 2018-06-02 DIAGNOSIS — N6310 Unspecified lump in the right breast, unspecified quadrant: Secondary | ICD-10-CM

## 2018-06-02 DIAGNOSIS — R928 Other abnormal and inconclusive findings on diagnostic imaging of breast: Secondary | ICD-10-CM

## 2018-06-08 ENCOUNTER — Ambulatory Visit
Admission: RE | Admit: 2018-06-08 | Discharge: 2018-06-08 | Disposition: A | Discharge status: Home / Self Care | Payer: PPO | Source: Ambulatory Visit | Attending: Specialist | Admitting: Specialist

## 2018-06-08 DIAGNOSIS — N6320 Unspecified lump in the left breast, unspecified quadrant: Secondary | ICD-10-CM | POA: Insufficient documentation

## 2018-06-08 DIAGNOSIS — R928 Other abnormal and inconclusive findings on diagnostic imaging of breast: Secondary | ICD-10-CM | POA: Insufficient documentation

## 2018-06-08 DIAGNOSIS — N6310 Unspecified lump in the right breast, unspecified quadrant: Secondary | ICD-10-CM | POA: Insufficient documentation

## 2018-06-13 DIAGNOSIS — E785 Hyperlipidemia, unspecified: Secondary | ICD-10-CM | POA: Diagnosis not present

## 2018-06-13 DIAGNOSIS — Z87898 Personal history of other specified conditions: Secondary | ICD-10-CM | POA: Diagnosis not present

## 2018-06-13 DIAGNOSIS — M85859 Other specified disorders of bone density and structure, unspecified thigh: Secondary | ICD-10-CM | POA: Diagnosis not present

## 2018-06-14 DIAGNOSIS — Z1231 Encounter for screening mammogram for malignant neoplasm of breast: Secondary | ICD-10-CM | POA: Diagnosis not present

## 2018-06-15 DIAGNOSIS — Z87898 Personal history of other specified conditions: Secondary | ICD-10-CM | POA: Diagnosis not present

## 2018-06-30 DIAGNOSIS — Z471 Aftercare following joint replacement surgery: Secondary | ICD-10-CM | POA: Diagnosis not present

## 2018-06-30 DIAGNOSIS — Z96641 Presence of right artificial hip joint: Secondary | ICD-10-CM | POA: Diagnosis not present

## 2018-07-21 DIAGNOSIS — M19011 Primary osteoarthritis, right shoulder: Secondary | ICD-10-CM | POA: Diagnosis not present

## 2018-07-21 DIAGNOSIS — M25511 Pain in right shoulder: Secondary | ICD-10-CM | POA: Diagnosis not present

## 2018-07-26 DIAGNOSIS — M1812 Unilateral primary osteoarthritis of first carpometacarpal joint, left hand: Secondary | ICD-10-CM | POA: Diagnosis not present

## 2018-07-26 DIAGNOSIS — M19042 Primary osteoarthritis, left hand: Secondary | ICD-10-CM | POA: Diagnosis not present

## 2018-07-26 DIAGNOSIS — M19031 Primary osteoarthritis, right wrist: Secondary | ICD-10-CM | POA: Diagnosis not present

## 2018-07-26 DIAGNOSIS — M19041 Primary osteoarthritis, right hand: Secondary | ICD-10-CM | POA: Diagnosis not present

## 2018-07-26 DIAGNOSIS — M19032 Primary osteoarthritis, left wrist: Secondary | ICD-10-CM | POA: Diagnosis not present

## 2018-07-26 DIAGNOSIS — M180 Bilateral primary osteoarthritis of first carpometacarpal joints: Secondary | ICD-10-CM | POA: Diagnosis not present

## 2018-07-26 DIAGNOSIS — M1811 Unilateral primary osteoarthritis of first carpometacarpal joint, right hand: Secondary | ICD-10-CM | POA: Diagnosis not present

## 2018-11-09 DIAGNOSIS — M2011 Hallux valgus (acquired), right foot: Secondary | ICD-10-CM | POA: Diagnosis not present

## 2018-11-09 DIAGNOSIS — M79671 Pain in right foot: Secondary | ICD-10-CM | POA: Diagnosis not present

## 2018-11-09 DIAGNOSIS — S93144A Subluxation of metatarsophalangeal joint of right lesser toe(s), initial encounter: Secondary | ICD-10-CM | POA: Diagnosis not present

## 2018-11-09 DIAGNOSIS — S93134A Subluxation of interphalangeal joint of right lesser toe(s), initial encounter: Secondary | ICD-10-CM | POA: Diagnosis not present

## 2018-11-09 DIAGNOSIS — S93521A Sprain of metatarsophalangeal joint of right great toe, initial encounter: Secondary | ICD-10-CM | POA: Diagnosis not present

## 2018-11-12 DIAGNOSIS — Z01818 Encounter for other preprocedural examination: Secondary | ICD-10-CM | POA: Diagnosis not present

## 2018-11-12 DIAGNOSIS — Z1159 Encounter for screening for other viral diseases: Secondary | ICD-10-CM | POA: Diagnosis not present

## 2018-11-15 DIAGNOSIS — S93144A Subluxation of metatarsophalangeal joint of right lesser toe(s), initial encounter: Secondary | ICD-10-CM | POA: Diagnosis not present

## 2018-11-15 DIAGNOSIS — M66871 Spontaneous rupture of other tendons, right ankle and foot: Secondary | ICD-10-CM | POA: Diagnosis not present

## 2018-11-15 DIAGNOSIS — M216X1 Other acquired deformities of right foot: Secondary | ICD-10-CM | POA: Diagnosis not present

## 2018-11-15 DIAGNOSIS — M205X1 Other deformities of toe(s) (acquired), right foot: Secondary | ICD-10-CM | POA: Diagnosis not present

## 2018-11-15 DIAGNOSIS — M2011 Hallux valgus (acquired), right foot: Secondary | ICD-10-CM | POA: Diagnosis not present

## 2018-11-15 DIAGNOSIS — M24571 Contracture, right ankle: Secondary | ICD-10-CM | POA: Diagnosis not present

## 2018-11-16 DIAGNOSIS — M2011 Hallux valgus (acquired), right foot: Secondary | ICD-10-CM | POA: Diagnosis not present

## 2018-11-30 DIAGNOSIS — M2011 Hallux valgus (acquired), right foot: Secondary | ICD-10-CM | POA: Diagnosis not present

## 2018-12-14 DIAGNOSIS — M2011 Hallux valgus (acquired), right foot: Secondary | ICD-10-CM | POA: Diagnosis not present

## 2018-12-14 DIAGNOSIS — S93521D Sprain of metatarsophalangeal joint of right great toe, subsequent encounter: Secondary | ICD-10-CM | POA: Diagnosis not present

## 2018-12-14 DIAGNOSIS — S93134D Subluxation of interphalangeal joint of right lesser toe(s), subsequent encounter: Secondary | ICD-10-CM | POA: Diagnosis not present

## 2018-12-14 DIAGNOSIS — M62471 Contracture of muscle, right ankle and foot: Secondary | ICD-10-CM | POA: Diagnosis not present

## 2018-12-14 DIAGNOSIS — S93144D Subluxation of metatarsophalangeal joint of right lesser toe(s), subsequent encounter: Secondary | ICD-10-CM | POA: Diagnosis not present

## 2018-12-28 DIAGNOSIS — S93134D Subluxation of interphalangeal joint of right lesser toe(s), subsequent encounter: Secondary | ICD-10-CM | POA: Diagnosis not present

## 2018-12-28 DIAGNOSIS — M2011 Hallux valgus (acquired), right foot: Secondary | ICD-10-CM | POA: Diagnosis not present

## 2018-12-28 DIAGNOSIS — S93521D Sprain of metatarsophalangeal joint of right great toe, subsequent encounter: Secondary | ICD-10-CM | POA: Diagnosis not present

## 2018-12-28 DIAGNOSIS — M62471 Contracture of muscle, right ankle and foot: Secondary | ICD-10-CM | POA: Diagnosis not present

## 2018-12-28 DIAGNOSIS — S93144D Subluxation of metatarsophalangeal joint of right lesser toe(s), subsequent encounter: Secondary | ICD-10-CM | POA: Diagnosis not present

## 2019-01-11 DIAGNOSIS — M2011 Hallux valgus (acquired), right foot: Secondary | ICD-10-CM | POA: Diagnosis not present

## 2019-01-17 DIAGNOSIS — M19011 Primary osteoarthritis, right shoulder: Secondary | ICD-10-CM | POA: Diagnosis not present

## 2019-01-23 DIAGNOSIS — Z01818 Encounter for other preprocedural examination: Secondary | ICD-10-CM | POA: Diagnosis not present

## 2019-01-23 DIAGNOSIS — M19011 Primary osteoarthritis, right shoulder: Secondary | ICD-10-CM | POA: Diagnosis not present

## 2019-02-08 DIAGNOSIS — S93134D Subluxation of interphalangeal joint of right lesser toe(s), subsequent encounter: Secondary | ICD-10-CM | POA: Diagnosis not present

## 2019-02-08 DIAGNOSIS — M2011 Hallux valgus (acquired), right foot: Secondary | ICD-10-CM | POA: Diagnosis not present

## 2019-02-08 DIAGNOSIS — M62471 Contracture of muscle, right ankle and foot: Secondary | ICD-10-CM | POA: Diagnosis not present

## 2019-02-08 DIAGNOSIS — S93521D Sprain of metatarsophalangeal joint of right great toe, subsequent encounter: Secondary | ICD-10-CM | POA: Diagnosis not present

## 2019-02-13 DIAGNOSIS — Z0000 Encounter for general adult medical examination without abnormal findings: Secondary | ICD-10-CM | POA: Diagnosis not present

## 2019-03-01 DIAGNOSIS — Z1159 Encounter for screening for other viral diseases: Secondary | ICD-10-CM | POA: Diagnosis not present

## 2019-03-03 DIAGNOSIS — Z20828 Contact with and (suspected) exposure to other viral communicable diseases: Secondary | ICD-10-CM | POA: Diagnosis not present

## 2019-03-03 DIAGNOSIS — E669 Obesity, unspecified: Secondary | ICD-10-CM | POA: Diagnosis not present

## 2019-03-03 DIAGNOSIS — M19011 Primary osteoarthritis, right shoulder: Secondary | ICD-10-CM | POA: Diagnosis not present

## 2019-03-03 DIAGNOSIS — Z96653 Presence of artificial knee joint, bilateral: Secondary | ICD-10-CM | POA: Diagnosis not present

## 2019-03-03 DIAGNOSIS — Z96611 Presence of right artificial shoulder joint: Secondary | ICD-10-CM | POA: Diagnosis not present

## 2019-03-03 DIAGNOSIS — Z791 Long term (current) use of non-steroidal anti-inflammatories (NSAID): Secondary | ICD-10-CM | POA: Diagnosis not present

## 2019-03-03 DIAGNOSIS — M25511 Pain in right shoulder: Secondary | ICD-10-CM | POA: Diagnosis not present

## 2019-03-03 DIAGNOSIS — Z6831 Body mass index (BMI) 31.0-31.9, adult: Secondary | ICD-10-CM | POA: Diagnosis not present

## 2019-03-03 DIAGNOSIS — Z96641 Presence of right artificial hip joint: Secondary | ICD-10-CM | POA: Diagnosis not present

## 2019-03-03 DIAGNOSIS — M180 Bilateral primary osteoarthritis of first carpometacarpal joints: Secondary | ICD-10-CM | POA: Diagnosis not present

## 2019-03-03 DIAGNOSIS — E785 Hyperlipidemia, unspecified: Secondary | ICD-10-CM | POA: Diagnosis not present

## 2019-03-03 DIAGNOSIS — Z471 Aftercare following joint replacement surgery: Secondary | ICD-10-CM | POA: Diagnosis not present

## 2019-03-03 DIAGNOSIS — Z79899 Other long term (current) drug therapy: Secondary | ICD-10-CM | POA: Diagnosis not present

## 2019-03-03 DIAGNOSIS — R52 Pain, unspecified: Secondary | ICD-10-CM | POA: Diagnosis not present

## 2019-03-03 DIAGNOSIS — G8918 Other acute postprocedural pain: Secondary | ICD-10-CM | POA: Diagnosis not present

## 2019-03-12 DIAGNOSIS — Z471 Aftercare following joint replacement surgery: Secondary | ICD-10-CM | POA: Diagnosis not present

## 2019-03-12 DIAGNOSIS — M19011 Primary osteoarthritis, right shoulder: Secondary | ICD-10-CM | POA: Diagnosis not present

## 2019-03-14 DIAGNOSIS — M19011 Primary osteoarthritis, right shoulder: Secondary | ICD-10-CM | POA: Diagnosis not present

## 2019-03-14 DIAGNOSIS — Z96611 Presence of right artificial shoulder joint: Secondary | ICD-10-CM | POA: Diagnosis not present

## 2019-03-15 DIAGNOSIS — S93144D Subluxation of metatarsophalangeal joint of right lesser toe(s), subsequent encounter: Secondary | ICD-10-CM | POA: Diagnosis not present

## 2019-03-15 DIAGNOSIS — S93521D Sprain of metatarsophalangeal joint of right great toe, subsequent encounter: Secondary | ICD-10-CM | POA: Diagnosis not present

## 2019-03-15 DIAGNOSIS — T85848A Pain due to other internal prosthetic devices, implants and grafts, initial encounter: Secondary | ICD-10-CM | POA: Diagnosis not present

## 2019-03-15 DIAGNOSIS — S93134D Subluxation of interphalangeal joint of right lesser toe(s), subsequent encounter: Secondary | ICD-10-CM | POA: Diagnosis not present

## 2019-03-15 DIAGNOSIS — M62471 Contracture of muscle, right ankle and foot: Secondary | ICD-10-CM | POA: Diagnosis not present

## 2019-03-15 DIAGNOSIS — M2011 Hallux valgus (acquired), right foot: Secondary | ICD-10-CM | POA: Diagnosis not present

## 2019-03-22 DIAGNOSIS — Z96611 Presence of right artificial shoulder joint: Secondary | ICD-10-CM | POA: Diagnosis not present

## 2019-03-22 DIAGNOSIS — Z96653 Presence of artificial knee joint, bilateral: Secondary | ICD-10-CM | POA: Diagnosis not present

## 2019-03-22 DIAGNOSIS — Z96641 Presence of right artificial hip joint: Secondary | ICD-10-CM | POA: Diagnosis not present

## 2019-03-22 DIAGNOSIS — Z471 Aftercare following joint replacement surgery: Secondary | ICD-10-CM | POA: Diagnosis not present

## 2019-03-30 DIAGNOSIS — M25511 Pain in right shoulder: Secondary | ICD-10-CM | POA: Diagnosis not present

## 2019-03-30 DIAGNOSIS — Z96611 Presence of right artificial shoulder joint: Secondary | ICD-10-CM | POA: Diagnosis not present

## 2019-03-30 DIAGNOSIS — M25611 Stiffness of right shoulder, not elsewhere classified: Secondary | ICD-10-CM | POA: Diagnosis not present

## 2019-04-05 DIAGNOSIS — M25611 Stiffness of right shoulder, not elsewhere classified: Secondary | ICD-10-CM | POA: Diagnosis not present

## 2019-04-05 DIAGNOSIS — M25511 Pain in right shoulder: Secondary | ICD-10-CM | POA: Diagnosis not present

## 2019-04-05 DIAGNOSIS — Z96611 Presence of right artificial shoulder joint: Secondary | ICD-10-CM | POA: Diagnosis not present

## 2019-04-11 DIAGNOSIS — M25611 Stiffness of right shoulder, not elsewhere classified: Secondary | ICD-10-CM | POA: Diagnosis not present

## 2019-04-11 DIAGNOSIS — M25511 Pain in right shoulder: Secondary | ICD-10-CM | POA: Diagnosis not present

## 2019-04-11 DIAGNOSIS — M19011 Primary osteoarthritis, right shoulder: Secondary | ICD-10-CM | POA: Diagnosis not present

## 2019-04-11 DIAGNOSIS — Z96611 Presence of right artificial shoulder joint: Secondary | ICD-10-CM | POA: Diagnosis not present

## 2019-04-11 DIAGNOSIS — Z471 Aftercare following joint replacement surgery: Secondary | ICD-10-CM | POA: Diagnosis not present

## 2019-04-14 DIAGNOSIS — M25611 Stiffness of right shoulder, not elsewhere classified: Secondary | ICD-10-CM | POA: Diagnosis not present

## 2019-04-14 DIAGNOSIS — M25511 Pain in right shoulder: Secondary | ICD-10-CM | POA: Diagnosis not present

## 2019-04-14 DIAGNOSIS — Z96611 Presence of right artificial shoulder joint: Secondary | ICD-10-CM | POA: Diagnosis not present

## 2019-04-18 DIAGNOSIS — Z96611 Presence of right artificial shoulder joint: Secondary | ICD-10-CM | POA: Diagnosis not present

## 2019-04-18 DIAGNOSIS — M25611 Stiffness of right shoulder, not elsewhere classified: Secondary | ICD-10-CM | POA: Diagnosis not present

## 2019-04-18 DIAGNOSIS — M25511 Pain in right shoulder: Secondary | ICD-10-CM | POA: Diagnosis not present

## 2019-04-25 DIAGNOSIS — M25611 Stiffness of right shoulder, not elsewhere classified: Secondary | ICD-10-CM | POA: Diagnosis not present

## 2019-04-25 DIAGNOSIS — M25511 Pain in right shoulder: Secondary | ICD-10-CM | POA: Diagnosis not present

## 2019-04-25 DIAGNOSIS — Z96611 Presence of right artificial shoulder joint: Secondary | ICD-10-CM | POA: Diagnosis not present

## 2019-04-28 DIAGNOSIS — Z96611 Presence of right artificial shoulder joint: Secondary | ICD-10-CM | POA: Diagnosis not present

## 2019-04-28 DIAGNOSIS — M25511 Pain in right shoulder: Secondary | ICD-10-CM | POA: Diagnosis not present

## 2019-04-28 DIAGNOSIS — M25611 Stiffness of right shoulder, not elsewhere classified: Secondary | ICD-10-CM | POA: Diagnosis not present

## 2019-05-01 DIAGNOSIS — Z96611 Presence of right artificial shoulder joint: Secondary | ICD-10-CM | POA: Diagnosis not present

## 2019-05-01 DIAGNOSIS — M25511 Pain in right shoulder: Secondary | ICD-10-CM | POA: Diagnosis not present

## 2019-05-01 DIAGNOSIS — M25611 Stiffness of right shoulder, not elsewhere classified: Secondary | ICD-10-CM | POA: Diagnosis not present

## 2019-05-08 DIAGNOSIS — M25511 Pain in right shoulder: Secondary | ICD-10-CM | POA: Diagnosis not present

## 2019-05-08 DIAGNOSIS — Z96611 Presence of right artificial shoulder joint: Secondary | ICD-10-CM | POA: Diagnosis not present

## 2019-05-08 DIAGNOSIS — M25611 Stiffness of right shoulder, not elsewhere classified: Secondary | ICD-10-CM | POA: Diagnosis not present

## 2019-05-15 DIAGNOSIS — S93521D Sprain of metatarsophalangeal joint of right great toe, subsequent encounter: Secondary | ICD-10-CM | POA: Diagnosis not present

## 2019-05-15 DIAGNOSIS — S93144D Subluxation of metatarsophalangeal joint of right lesser toe(s), subsequent encounter: Secondary | ICD-10-CM | POA: Diagnosis not present

## 2019-05-15 DIAGNOSIS — M2011 Hallux valgus (acquired), right foot: Secondary | ICD-10-CM | POA: Diagnosis not present

## 2019-05-15 DIAGNOSIS — Z9889 Other specified postprocedural states: Secondary | ICD-10-CM | POA: Diagnosis not present

## 2019-05-15 DIAGNOSIS — M2012 Hallux valgus (acquired), left foot: Secondary | ICD-10-CM | POA: Diagnosis not present

## 2019-05-22 DIAGNOSIS — M25511 Pain in right shoulder: Secondary | ICD-10-CM | POA: Diagnosis not present

## 2019-05-22 DIAGNOSIS — M25611 Stiffness of right shoulder, not elsewhere classified: Secondary | ICD-10-CM | POA: Diagnosis not present

## 2019-05-22 DIAGNOSIS — Z96611 Presence of right artificial shoulder joint: Secondary | ICD-10-CM | POA: Diagnosis not present

## 2019-08-10 DIAGNOSIS — M2011 Hallux valgus (acquired), right foot: Secondary | ICD-10-CM | POA: Diagnosis not present

## 2019-08-10 DIAGNOSIS — T8484XA Pain due to internal orthopedic prosthetic devices, implants and grafts, initial encounter: Secondary | ICD-10-CM | POA: Diagnosis not present

## 2019-08-10 DIAGNOSIS — M2041 Other hammer toe(s) (acquired), right foot: Secondary | ICD-10-CM | POA: Diagnosis not present

## 2019-08-10 DIAGNOSIS — B351 Tinea unguium: Secondary | ICD-10-CM | POA: Diagnosis not present

## 2019-08-10 DIAGNOSIS — L600 Ingrowing nail: Secondary | ICD-10-CM | POA: Diagnosis not present

## 2019-08-10 DIAGNOSIS — M2042 Other hammer toe(s) (acquired), left foot: Secondary | ICD-10-CM | POA: Diagnosis not present

## 2019-08-10 DIAGNOSIS — M19071 Primary osteoarthritis, right ankle and foot: Secondary | ICD-10-CM | POA: Diagnosis not present

## 2019-08-10 DIAGNOSIS — M2012 Hallux valgus (acquired), left foot: Secondary | ICD-10-CM | POA: Diagnosis not present

## 2019-08-10 DIAGNOSIS — M19072 Primary osteoarthritis, left ankle and foot: Secondary | ICD-10-CM | POA: Diagnosis not present

## 2019-08-24 DIAGNOSIS — M2012 Hallux valgus (acquired), left foot: Secondary | ICD-10-CM | POA: Diagnosis not present

## 2019-08-24 DIAGNOSIS — M19072 Primary osteoarthritis, left ankle and foot: Secondary | ICD-10-CM | POA: Diagnosis not present

## 2019-08-24 DIAGNOSIS — M19071 Primary osteoarthritis, right ankle and foot: Secondary | ICD-10-CM | POA: Diagnosis not present

## 2019-08-24 DIAGNOSIS — M2041 Other hammer toe(s) (acquired), right foot: Secondary | ICD-10-CM | POA: Diagnosis not present

## 2019-08-24 DIAGNOSIS — M2011 Hallux valgus (acquired), right foot: Secondary | ICD-10-CM | POA: Diagnosis not present

## 2019-08-24 DIAGNOSIS — B351 Tinea unguium: Secondary | ICD-10-CM | POA: Diagnosis not present

## 2019-08-24 DIAGNOSIS — M2042 Other hammer toe(s) (acquired), left foot: Secondary | ICD-10-CM | POA: Diagnosis not present

## 2019-08-24 DIAGNOSIS — T8484XA Pain due to internal orthopedic prosthetic devices, implants and grafts, initial encounter: Secondary | ICD-10-CM | POA: Diagnosis not present

## 2019-08-24 DIAGNOSIS — L600 Ingrowing nail: Secondary | ICD-10-CM | POA: Diagnosis not present

## 2019-09-18 DIAGNOSIS — Z1231 Encounter for screening mammogram for malignant neoplasm of breast: Secondary | ICD-10-CM | POA: Diagnosis not present

## 2019-10-10 DIAGNOSIS — M79645 Pain in left finger(s): Secondary | ICD-10-CM | POA: Diagnosis not present

## 2019-10-10 DIAGNOSIS — M79644 Pain in right finger(s): Secondary | ICD-10-CM | POA: Diagnosis not present

## 2019-10-10 DIAGNOSIS — M19049 Primary osteoarthritis, unspecified hand: Secondary | ICD-10-CM | POA: Diagnosis not present

## 2019-10-16 DIAGNOSIS — Z01812 Encounter for preprocedural laboratory examination: Secondary | ICD-10-CM | POA: Diagnosis not present

## 2019-10-16 DIAGNOSIS — Z20822 Contact with and (suspected) exposure to covid-19: Secondary | ICD-10-CM | POA: Diagnosis not present

## 2019-10-18 DIAGNOSIS — M1811 Unilateral primary osteoarthritis of first carpometacarpal joint, right hand: Secondary | ICD-10-CM | POA: Diagnosis not present

## 2019-10-18 DIAGNOSIS — Z7982 Long term (current) use of aspirin: Secondary | ICD-10-CM | POA: Diagnosis not present

## 2019-10-18 DIAGNOSIS — D689 Coagulation defect, unspecified: Secondary | ICD-10-CM | POA: Diagnosis not present

## 2019-10-18 DIAGNOSIS — E785 Hyperlipidemia, unspecified: Secondary | ICD-10-CM | POA: Diagnosis not present

## 2019-10-18 DIAGNOSIS — K219 Gastro-esophageal reflux disease without esophagitis: Secondary | ICD-10-CM | POA: Diagnosis not present

## 2019-10-18 DIAGNOSIS — G5620 Lesion of ulnar nerve, unspecified upper limb: Secondary | ICD-10-CM | POA: Diagnosis not present

## 2019-10-18 DIAGNOSIS — G8918 Other acute postprocedural pain: Secondary | ICD-10-CM | POA: Diagnosis not present

## 2019-10-18 DIAGNOSIS — Z79899 Other long term (current) drug therapy: Secondary | ICD-10-CM | POA: Diagnosis not present

## 2019-10-31 DIAGNOSIS — M1811 Unilateral primary osteoarthritis of first carpometacarpal joint, right hand: Secondary | ICD-10-CM | POA: Diagnosis not present

## 2019-11-14 DIAGNOSIS — M79645 Pain in left finger(s): Secondary | ICD-10-CM | POA: Diagnosis not present

## 2019-11-14 DIAGNOSIS — M79644 Pain in right finger(s): Secondary | ICD-10-CM | POA: Diagnosis not present

## 2019-11-14 DIAGNOSIS — M25641 Stiffness of right hand, not elsewhere classified: Secondary | ICD-10-CM | POA: Diagnosis not present

## 2019-11-14 DIAGNOSIS — Z9889 Other specified postprocedural states: Secondary | ICD-10-CM | POA: Diagnosis not present

## 2019-11-14 DIAGNOSIS — R29898 Other symptoms and signs involving the musculoskeletal system: Secondary | ICD-10-CM | POA: Diagnosis not present

## 2019-11-14 DIAGNOSIS — Z4689 Encounter for fitting and adjustment of other specified devices: Secondary | ICD-10-CM | POA: Diagnosis not present

## 2019-11-16 DIAGNOSIS — L219 Seborrheic dermatitis, unspecified: Secondary | ICD-10-CM | POA: Diagnosis not present

## 2019-11-16 DIAGNOSIS — L821 Other seborrheic keratosis: Secondary | ICD-10-CM | POA: Diagnosis not present

## 2019-11-16 DIAGNOSIS — L578 Other skin changes due to chronic exposure to nonionizing radiation: Secondary | ICD-10-CM | POA: Diagnosis not present

## 2019-11-16 DIAGNOSIS — Z1283 Encounter for screening for malignant neoplasm of skin: Secondary | ICD-10-CM | POA: Diagnosis not present

## 2019-11-16 DIAGNOSIS — L608 Other nail disorders: Secondary | ICD-10-CM | POA: Diagnosis not present

## 2019-11-25 IMAGING — MG DIGITAL DIAGNOSTIC BILATERAL MAMMOGRAM WITH TOMO AND CAD
8 series · 8 of 24 positions shown · non-contrast
Comparison: Previous exam(s).

CLINICAL DATA: Patient was called back from screening mammogram for
possible masses in both breast.

EXAM:
DIGITAL DIAGNOSTIC BILATERAL MAMMOGRAM WITH CAD AND TOMO

[R CC synth-2D]
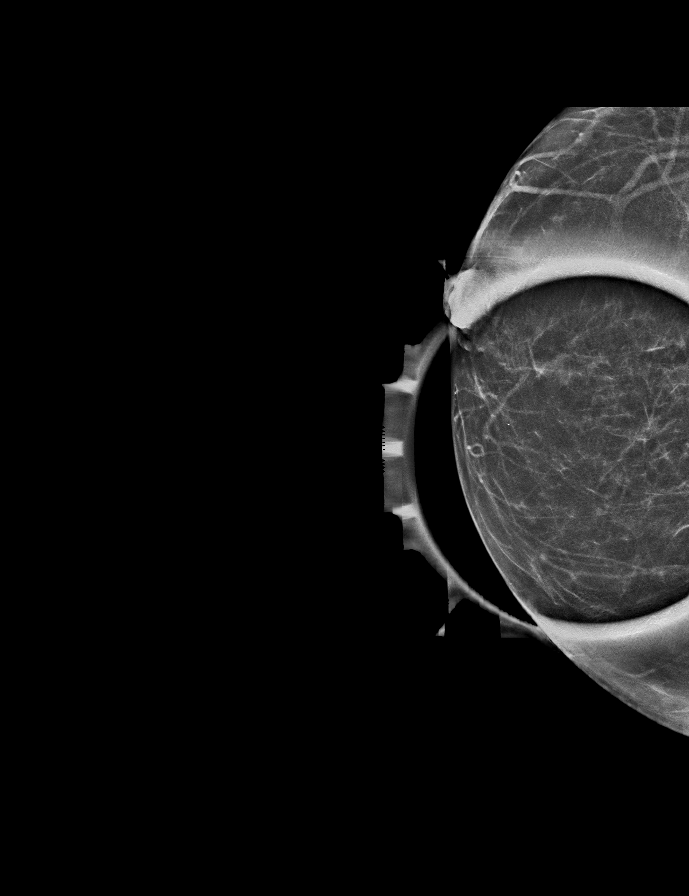

[L ML synth-2D]
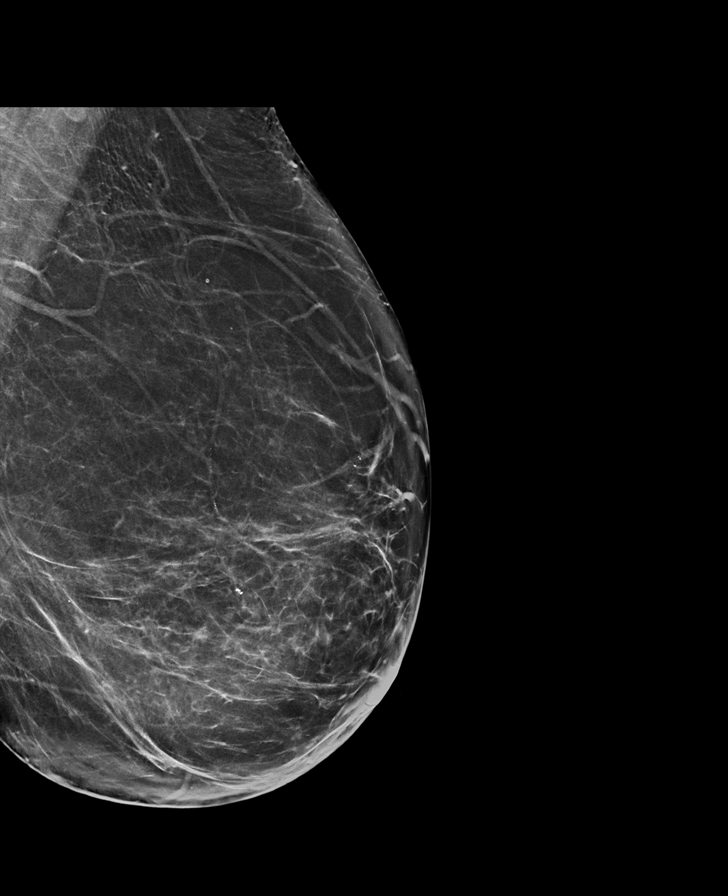

[R MLO synth-2D]
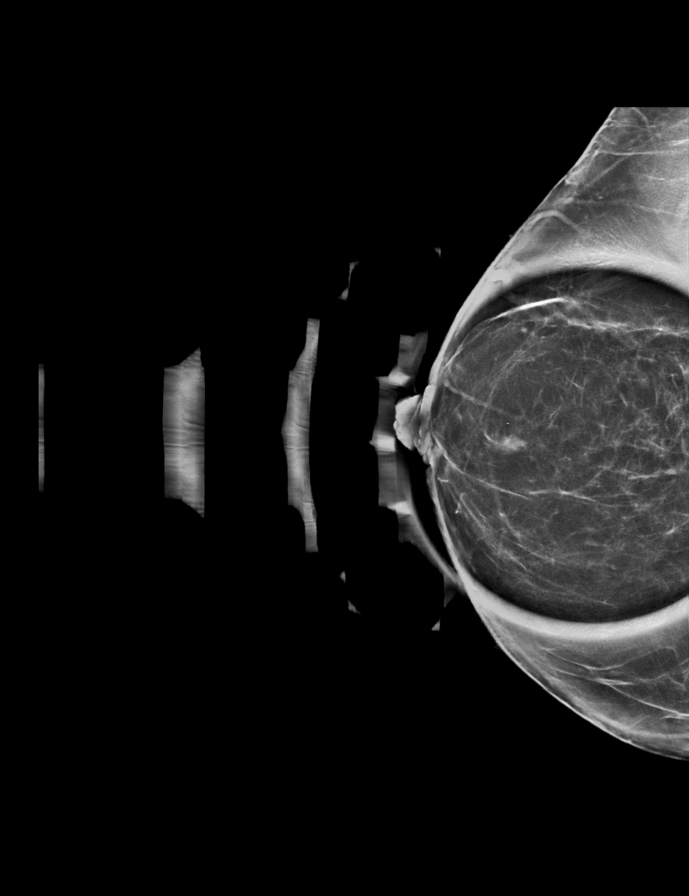

[L MLO synth-2D]
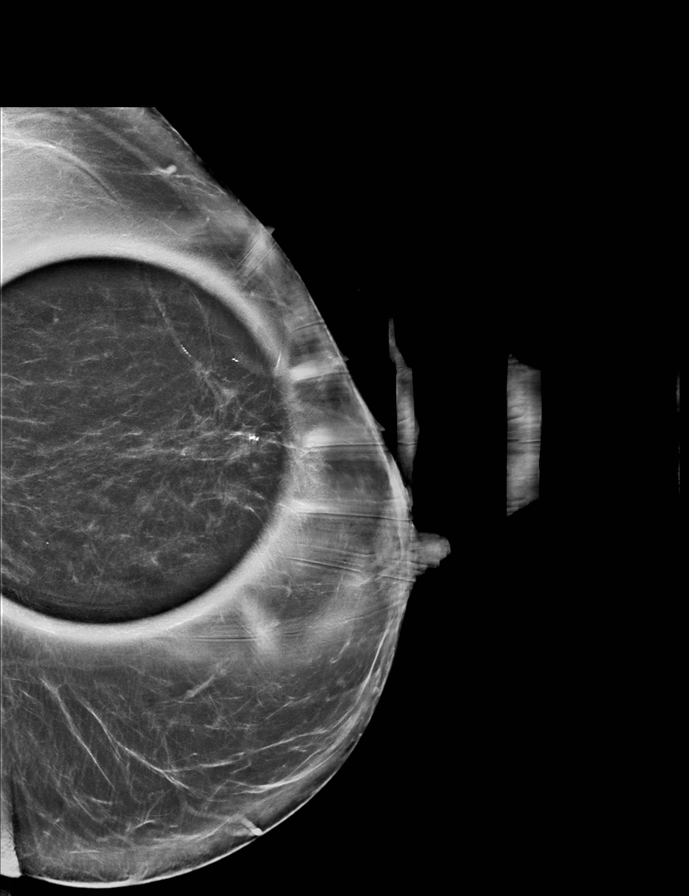

[L MLO tomo · tomo slice 41/82.0]
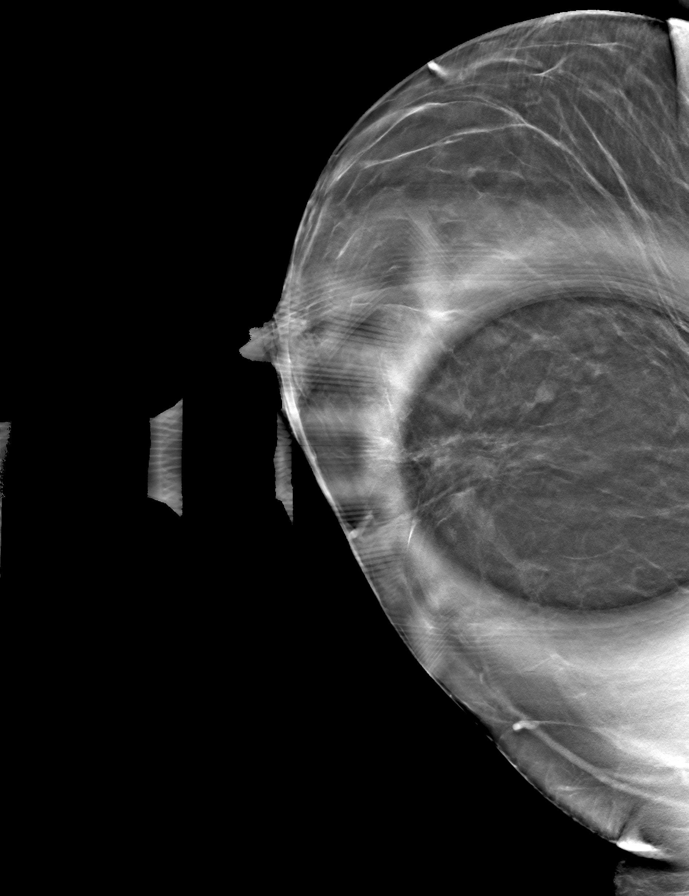

[R CC tomo · tomo slice 25/50.0]
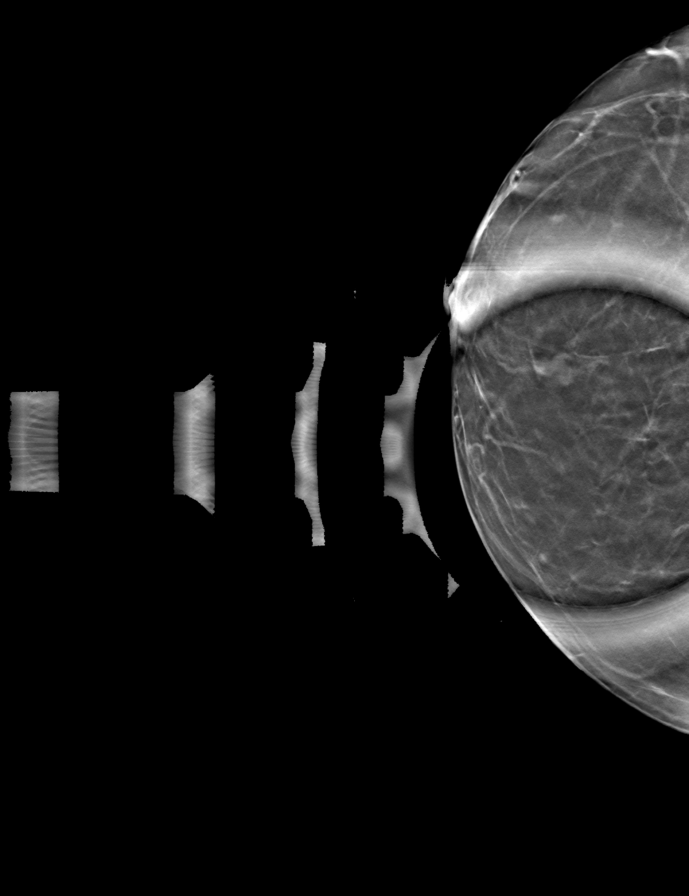

[R MLO tomo · tomo slice 31/62.0]
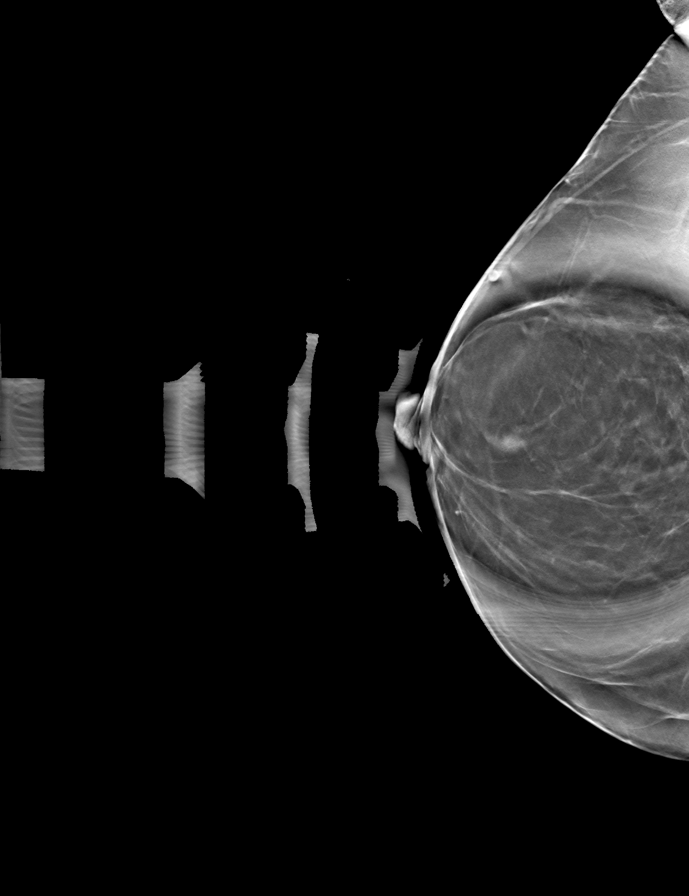

[L ML tomo · tomo slice 41/81.0]
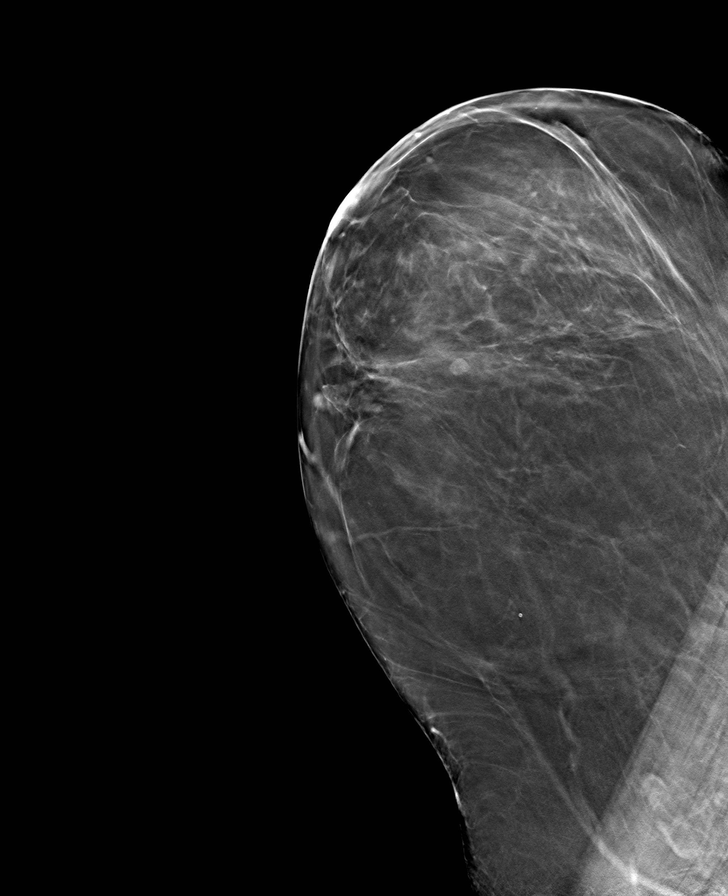

[8 of 24 positions shown; findings below may reference images not displayed]

ACR Breast Density Category b: There are scattered areas of
fibroglandular density.
FINDINGS: Additional imaging of both breast was performed. Parenchymal pattern
of both breast is stable from prior exams. The asymmetry in the
subareolar region of the right breast has been stable compared to
prior exams dated 02/06/2011. The low-density nodule in the left
breast with central fat is unchanged from prior exam dated
04/20/2013. No new suspicious mass or malignant type
microcalcifications identified in either breast.

Mammographic images were processed with CAD.
IMPRESSION: No evidence of malignancy in either breast.

RECOMMENDATION:
Bilateral screening mammogram in 1 year is recommended.

I have discussed the findings and recommendations with the patient.
Results were also provided in writing at the conclusion of the
visit. If applicable, a reminder letter will be sent to the patient
regarding the next appointment.

BI-RADS CATEGORY  2: Benign.

## 2019-11-28 DIAGNOSIS — H2513 Age-related nuclear cataract, bilateral: Secondary | ICD-10-CM | POA: Diagnosis not present

## 2019-12-12 DIAGNOSIS — M2041 Other hammer toe(s) (acquired), right foot: Secondary | ICD-10-CM | POA: Diagnosis not present

## 2019-12-12 DIAGNOSIS — M19071 Primary osteoarthritis, right ankle and foot: Secondary | ICD-10-CM | POA: Diagnosis not present

## 2019-12-12 DIAGNOSIS — B351 Tinea unguium: Secondary | ICD-10-CM | POA: Diagnosis not present

## 2019-12-12 DIAGNOSIS — M2012 Hallux valgus (acquired), left foot: Secondary | ICD-10-CM | POA: Diagnosis not present

## 2019-12-12 DIAGNOSIS — L601 Onycholysis: Secondary | ICD-10-CM | POA: Diagnosis not present

## 2019-12-12 DIAGNOSIS — T8484XA Pain due to internal orthopedic prosthetic devices, implants and grafts, initial encounter: Secondary | ICD-10-CM | POA: Diagnosis not present

## 2019-12-12 DIAGNOSIS — M2042 Other hammer toe(s) (acquired), left foot: Secondary | ICD-10-CM | POA: Diagnosis not present

## 2019-12-12 DIAGNOSIS — M2011 Hallux valgus (acquired), right foot: Secondary | ICD-10-CM | POA: Diagnosis not present

## 2019-12-12 DIAGNOSIS — M19072 Primary osteoarthritis, left ankle and foot: Secondary | ICD-10-CM | POA: Diagnosis not present

## 2020-01-16 DIAGNOSIS — Z4689 Encounter for fitting and adjustment of other specified devices: Secondary | ICD-10-CM | POA: Diagnosis not present

## 2020-01-16 DIAGNOSIS — R29898 Other symptoms and signs involving the musculoskeletal system: Secondary | ICD-10-CM | POA: Diagnosis not present

## 2020-01-16 DIAGNOSIS — M25641 Stiffness of right hand, not elsewhere classified: Secondary | ICD-10-CM | POA: Diagnosis not present

## 2020-01-16 DIAGNOSIS — M79644 Pain in right finger(s): Secondary | ICD-10-CM | POA: Diagnosis not present

## 2020-01-16 DIAGNOSIS — M79645 Pain in left finger(s): Secondary | ICD-10-CM | POA: Diagnosis not present

## 2020-01-16 DIAGNOSIS — Z9889 Other specified postprocedural states: Secondary | ICD-10-CM | POA: Diagnosis not present

## 2020-05-03 DIAGNOSIS — M19049 Primary osteoarthritis, unspecified hand: Secondary | ICD-10-CM | POA: Diagnosis not present

## 2020-05-03 DIAGNOSIS — M65331 Trigger finger, right middle finger: Secondary | ICD-10-CM | POA: Diagnosis not present

## 2020-06-11 DIAGNOSIS — M19049 Primary osteoarthritis, unspecified hand: Secondary | ICD-10-CM | POA: Diagnosis not present

## 2020-06-11 DIAGNOSIS — M720 Palmar fascial fibromatosis [Dupuytren]: Secondary | ICD-10-CM | POA: Diagnosis not present

## 2020-06-11 DIAGNOSIS — G5621 Lesion of ulnar nerve, right upper limb: Secondary | ICD-10-CM | POA: Diagnosis not present

## 2020-06-25 DIAGNOSIS — Z0000 Encounter for general adult medical examination without abnormal findings: Secondary | ICD-10-CM | POA: Diagnosis not present

## 2020-06-25 DIAGNOSIS — Z8719 Personal history of other diseases of the digestive system: Secondary | ICD-10-CM | POA: Diagnosis not present

## 2020-06-25 DIAGNOSIS — Z1231 Encounter for screening mammogram for malignant neoplasm of breast: Secondary | ICD-10-CM | POA: Diagnosis not present

## 2020-06-25 DIAGNOSIS — M85859 Other specified disorders of bone density and structure, unspecified thigh: Secondary | ICD-10-CM | POA: Diagnosis not present

## 2020-06-25 DIAGNOSIS — E785 Hyperlipidemia, unspecified: Secondary | ICD-10-CM | POA: Diagnosis not present

## 2020-06-26 DIAGNOSIS — R202 Paresthesia of skin: Secondary | ICD-10-CM | POA: Diagnosis not present

## 2020-06-26 DIAGNOSIS — M19049 Primary osteoarthritis, unspecified hand: Secondary | ICD-10-CM | POA: Diagnosis not present

## 2020-06-26 DIAGNOSIS — M6281 Muscle weakness (generalized): Secondary | ICD-10-CM | POA: Diagnosis not present

## 2020-06-26 DIAGNOSIS — G5621 Lesion of ulnar nerve, right upper limb: Secondary | ICD-10-CM | POA: Diagnosis not present

## 2020-07-23 DIAGNOSIS — L0591 Pilonidal cyst without abscess: Secondary | ICD-10-CM | POA: Diagnosis not present

## 2020-07-23 DIAGNOSIS — L729 Follicular cyst of the skin and subcutaneous tissue, unspecified: Secondary | ICD-10-CM | POA: Diagnosis not present

## 2020-07-24 DIAGNOSIS — M2011 Hallux valgus (acquired), right foot: Secondary | ICD-10-CM | POA: Diagnosis not present

## 2020-07-24 DIAGNOSIS — M2012 Hallux valgus (acquired), left foot: Secondary | ICD-10-CM | POA: Diagnosis not present

## 2020-07-24 DIAGNOSIS — S93144D Subluxation of metatarsophalangeal joint of right lesser toe(s), subsequent encounter: Secondary | ICD-10-CM | POA: Diagnosis not present

## 2020-07-24 DIAGNOSIS — T85848A Pain due to other internal prosthetic devices, implants and grafts, initial encounter: Secondary | ICD-10-CM | POA: Diagnosis not present

## 2020-07-24 DIAGNOSIS — X58XXXD Exposure to other specified factors, subsequent encounter: Secondary | ICD-10-CM | POA: Diagnosis not present

## 2020-07-24 DIAGNOSIS — S93521D Sprain of metatarsophalangeal joint of right great toe, subsequent encounter: Secondary | ICD-10-CM | POA: Diagnosis not present

## 2020-08-01 DIAGNOSIS — T8484XA Pain due to internal orthopedic prosthetic devices, implants and grafts, initial encounter: Secondary | ICD-10-CM | POA: Diagnosis not present

## 2020-08-01 DIAGNOSIS — T85848A Pain due to other internal prosthetic devices, implants and grafts, initial encounter: Secondary | ICD-10-CM | POA: Diagnosis not present

## 2020-08-02 DIAGNOSIS — S93521D Sprain of metatarsophalangeal joint of right great toe, subsequent encounter: Secondary | ICD-10-CM | POA: Diagnosis not present

## 2020-08-02 DIAGNOSIS — S93144D Subluxation of metatarsophalangeal joint of right lesser toe(s), subsequent encounter: Secondary | ICD-10-CM | POA: Diagnosis not present

## 2020-08-02 DIAGNOSIS — T85848A Pain due to other internal prosthetic devices, implants and grafts, initial encounter: Secondary | ICD-10-CM | POA: Diagnosis not present

## 2020-08-02 DIAGNOSIS — M2011 Hallux valgus (acquired), right foot: Secondary | ICD-10-CM | POA: Diagnosis not present

## 2020-08-14 DIAGNOSIS — G5621 Lesion of ulnar nerve, right upper limb: Secondary | ICD-10-CM | POA: Diagnosis not present

## 2020-08-14 DIAGNOSIS — M6281 Muscle weakness (generalized): Secondary | ICD-10-CM | POA: Diagnosis not present

## 2020-08-14 DIAGNOSIS — R202 Paresthesia of skin: Secondary | ICD-10-CM | POA: Diagnosis not present

## 2020-08-20 DIAGNOSIS — M2012 Hallux valgus (acquired), left foot: Secondary | ICD-10-CM | POA: Diagnosis not present

## 2020-08-20 DIAGNOSIS — T85848D Pain due to other internal prosthetic devices, implants and grafts, subsequent encounter: Secondary | ICD-10-CM | POA: Diagnosis not present

## 2020-09-17 DIAGNOSIS — Z1231 Encounter for screening mammogram for malignant neoplasm of breast: Secondary | ICD-10-CM | POA: Diagnosis not present

## 2020-09-25 DIAGNOSIS — M6281 Muscle weakness (generalized): Secondary | ICD-10-CM | POA: Diagnosis not present

## 2020-09-25 DIAGNOSIS — R202 Paresthesia of skin: Secondary | ICD-10-CM | POA: Diagnosis not present

## 2020-09-25 DIAGNOSIS — G5621 Lesion of ulnar nerve, right upper limb: Secondary | ICD-10-CM | POA: Diagnosis not present

## 2020-11-01 DIAGNOSIS — R222 Localized swelling, mass and lump, trunk: Secondary | ICD-10-CM | POA: Diagnosis not present

## 2021-01-23 DIAGNOSIS — L0591 Pilonidal cyst without abscess: Secondary | ICD-10-CM | POA: Diagnosis not present

## 2021-01-30 DIAGNOSIS — H2513 Age-related nuclear cataract, bilateral: Secondary | ICD-10-CM | POA: Diagnosis not present

## 2021-02-10 ENCOUNTER — Other Ambulatory Visit: Payer: Self-pay

## 2021-02-10 ENCOUNTER — Telehealth: Payer: Self-pay | Admitting: *Deleted

## 2021-02-10 ENCOUNTER — Encounter: Payer: Self-pay | Admitting: Surgery

## 2021-02-10 ENCOUNTER — Ambulatory Visit: Payer: PPO | Admitting: Surgery

## 2021-02-10 VITALS — BP 104/85 | HR 91 | Temp 97.7°F | Ht 66.0 in | Wt 172.4 lb

## 2021-02-10 DIAGNOSIS — D171 Benign lipomatous neoplasm of skin and subcutaneous tissue of trunk: Secondary | ICD-10-CM | POA: Diagnosis not present

## 2021-02-10 NOTE — Progress Notes (Signed)
02/10/2021  Reason for Visit:  Back lipoma  Referring Provider:  Danae Orleans, MD  History of Present Illness: Felicia Romero is a 80 y.o. female presenting for evaluation of a back lipoma.  The patient reports that she's had this for about a year.  It's located in the right lower back.  She reports pain in a very focal spot on her back, which gets aggravated by any motion or activity.  The pain does not radiate.  The mass is not growing in size but the pain has persisted.  She has seen a dermatologist and another surgeon, but he was not in her insurance network.  She reports that she has other small lipomas in her body but they do not cause any discomfort.  Past Medical History: Past Medical History:  Diagnosis Date   Arthritis    Hernia, epigastric      Past Surgical History: Past Surgical History:  Procedure Laterality Date   ABDOMINAL HYSTERECTOMY  1994   BREAST BIOPSY Left 08/31/1971   neg   COLONOSCOPY W/ BIOPSIES  09/2015   TONSILLECTOMY  1950   TOTAL KNEE ARTHROPLASTY Bilateral 2016    Home Medications: Prior to Admission medications   Medication Sig Start Date End Date Taking? Authorizing Provider  acetaminophen (TYLENOL) 500 MG tablet Take 2 tablets (1,000 mg total) by mouth 2 (two) times daily. 08/12/16  Yes Plonk, Gwyndolyn Saxon, MD  Ascorbic Acid (VITAMIN C) 1000 MG tablet Take 1,000 mg by mouth daily.   Yes [provider]  Biotin 5000 MCG TABS Take 2,000 mcg by mouth.    Yes [provider]  celecoxib (CELEBREX) 100 MG capsule Take 100 mg by mouth daily as needed.   Yes [provider]  ketoconazole (NIZORAL) 2 % cream Apply 1 application topically daily.   Yes [provider]  Multiple Vitamin (MULTIVITAMIN) tablet Take 1 tablet by mouth daily.   Yes [provider]  VITAMIN D, CHOLECALCIFEROL, PO Take 1,000 Units by mouth daily.   Yes [provider]    Allergies: No Known Allergies  Social  History:  reports that she has never smoked. She has never used smokeless tobacco. She reports that she does not drink alcohol and does not use drugs.   Family History: Family History  Problem Relation Age of Onset   Breast cancer Maternal Aunt 13   CAD Mother    Stroke Mother    Prostate cancer Father    Thyroid cancer Daughter     Review of Systems: Review of Systems  Constitutional:  Negative for chills and fever.  Respiratory:  Negative for shortness of breath.   Cardiovascular:  Negative for chest pain.  Gastrointestinal:  Negative for nausea and vomiting.  Musculoskeletal:  Positive for back pain.  Skin:        Mass in right lower back   Physical Exam BP 104/85   Pulse 91   Temp 97.7 F (36.5 C) (Oral)   Ht '5\' 6"'$  (1.676 m)   Wt 172 lb 6.4 oz (78.2 kg)   SpO2 95%   BMI 27.83 kg/m  CONSTITUTIONAL: No acute distress HEENT:  Normocephalic, atraumatic, extraocular motion intact. NECK: Trachea is midline, and there is no jugular venous distension.  RESPIRATORY:  Normal respiratory effort without pathologic use of accessory muscles. CARDIOVASCULAR: Regular rhythm and rate. MUSCULOSKELETAL:  Normal muscle strength and tone in all four extremities.  No peripheral edema or cyanosis. SKIN: The patient has a 2 cm mass in the right  lower back that appears to be deep.  This is mobile, soft, but does cause tenderness when pushing on it.  There are no masses on the left lower back. NEUROLOGIC:  Motor and sensation is grossly normal.  Cranial nerves are grossly intact. PSYCH:  Alert and oriented to person, place and time. Affect is normal.  Laboratory Analysis: No results found for this or any previous visit (from the past 24 hour(s)).  Imaging: No results found.  Assessment and Plan: This is a 80 y.o. 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  This is a deeper lipoma which  is causing irritation to the surrounding sensory nerves.  However, this is a mass that can be excised and treated.  Discussed with her the potential options for excision as an office procedure vs in the operating room.  In the office, would not be able to provide any anesthesia other than local anesthesia at the incision site, but would also not involve hospital/anesthesia charges.  The incision would the the same otherwise.  Discussed the risks of bleeding, infection, injury to surrounding structures, and she's willing to proceed. --She will think about preference of doing this in the office or in the OR and will call us back with her decision and to schedule the procedure.  Face-to-face time spent with the patient and care providers was 40 minutes, with more than 50% of the time spent counseling, educating, and coordinating care of the patient.     Melvyn Neth, [facility]

## 2021-02-10 NOTE — Patient Instructions (Addendum)
If you have any concerns or questions, please feel free to call our office.  Lipoma A lipoma is a noncancerous (benign) tumor that is made up of fat cells. This is a very common type of soft-tissue growth. Lipomas are usually found under the skin (subcutaneous). They may occur in any tissue of the body that contains fat. Common areas for lipomas to appear include the back, arms, shoulders, buttocks, and thighs. Lipomas grow slowly, and they are usually painless. Most lipomas do not cause problems and do not require treatment. What are the causes? The cause of this condition is not known. What increases the risk? You are more likely to develop this condition if: You are 19-83 years old. You have a family history of lipomas. What are the signs or symptoms? A lipoma usually appears as a small, round bump under the skin. In most cases, the lump will: Feel soft or rubbery. Not cause pain or other symptoms. However, if a lipoma is located in an area where it pushes on nerves, it can become painful or cause other symptoms. How is this diagnosed? A lipoma can usually be diagnosed with a physical exam. You may also have tests to confirm the diagnosis and to rule out other conditions. Tests may include: Imaging tests, such as a CT scan or an MRI. Removal of a tissue sample to be looked at under a microscope (biopsy). How is this treated? Treatment for this condition depends on the size of the lipoma and whether it is causing any symptoms. For small lipomas that are not causing problems, no treatment is needed. If a lipoma is bigger or it causes problems, surgery may be done to remove the lipoma. Lipomas can also be removed to improve appearance. Most often, the procedure is done after applying a medicine that numbs the area (local anesthetic). Liposuction may be done to reduce the size of the lipoma before it is removed through surgery, or it may be done to remove the lipoma. Lipomas are removed with  this method in order to limit incision size and scarring. A liposuction tube is inserted through a small incision into the lipoma, and the contents of the lipoma are removed through the tube with suction. Follow these instructions at home: Watch your lipoma for any changes. Keep all follow-up visits as told by your health care provider. This is important. Contact a health care provider if: Your lipoma becomes larger or hard. Your lipoma becomes painful, red, or increasingly swollen. These could be signs of infection or a more serious condition. Get help right away if: You develop tingling or numbness in an area near the lipoma. This could indicate that the lipoma is causing nerve damage. Summary A lipoma is a noncancerous tumor that is made up of fat cells. Most lipomas do not cause problems and do not require treatment. If a lipoma is bigger or it causes problems, surgery may be done to remove the lipoma. Contact a health care provider if your lipoma becomes larger or hard, or if it becomes painful, red, or increasingly swollen. Pain, redness, and swelling could be signs of infection or a more serious condition. This information is not intended to replace advice given to you by your health care provider. Make sure you discuss any questions you have with your health care provider. Document Revised: 01/02/2019 Document Reviewed: 01/02/2019 Elsevier Patient Education  [place].

## 2021-02-10 NOTE — Telephone Encounter (Signed)
Patient just saw you in [place] this morning and she called to get an appointment to have the lipoma removed in the office. She wants to know what code that we will be using so she can find out how much her out of pocket will cost.

## 2021-02-17 ENCOUNTER — Other Ambulatory Visit: Payer: Self-pay

## 2021-02-17 ENCOUNTER — Ambulatory Visit: Payer: PPO | Admitting: Surgery

## 2021-02-17 ENCOUNTER — Other Ambulatory Visit: Payer: Self-pay | Admitting: Surgery

## 2021-02-17 ENCOUNTER — Encounter: Payer: Self-pay | Admitting: Surgery

## 2021-02-17 VITALS — BP 143/82 | HR 75 | Temp 98.7°F | Wt 173.0 lb

## 2021-02-17 DIAGNOSIS — D171 Benign lipomatous neoplasm of skin and subcutaneous tissue of trunk: Secondary | ICD-10-CM | POA: Diagnosis not present

## 2021-02-17 NOTE — Patient Instructions (Addendum)
Avoid bending forward and twisting for the next couple weeks.   If you have any concerns or questions, please feel free to call our office. See follow up appointment below.   Lipoma Removal, Care After This sheet gives you information about how to care for yourself after your procedure. Your health care provider may also give you more specific instructions. If you have problems or questions, contact your health care provider. What can I expect after the procedure? After the procedure, it is common to have: Mild pain. Swelling. Bruising. Follow these instructions at home: Bathing  Do not take baths, swim, or use a hot tub until your health care provider approves. Ask your health care provider if you may take showers. You may only be allowed to take sponge baths. Keep your bandage (dressing) dry until your health care provider says it can be removed. Incision care  Follow instructions from your health care provider about how to take care of your incision. Make sure you: Wash your hands with soap and water for at least 20 seconds before and after you change your dressing. If soap and water are not available, use hand sanitizer. Change your dressing as told by your health care provider. Leave stitches (sutures), skin glue, or adhesive strips in place. These skin closures may need to stay in place for 2 weeks or longer. If adhesive strip edges start to loosen and curl up, you may trim the loose edges. Do not remove adhesive strips completely unless your health care provider tells you to do that. Check your incision area every day for signs of infection. Check for: More redness, swelling, or pain. Fluid or blood. Warmth. Pus or a bad smell. Medicines Take over-the-counter and prescription medicines only as told by your health care provider. If you were prescribed an antibiotic medicine, use it as told by your health care provider. Do not stop using the antibiotic even if you start to feel  better. General instructions  If you were given a sedative during the procedure, it can affect you for several hours. Do not drive or operate machinery until your health care provider says that it is safe. Do not use any products that contain nicotine or tobacco, such as cigarettes, e-cigarettes, and chewing tobacco. These can delay healing. If you need help quitting, ask your health care provider. Return to your normal activities as told by your health care provider. Ask your health care provider what activities are safe for you. Keep all follow-up visits as told by your health care provider. This is important. Contact a health care provider if: You have more redness, swelling, or pain around your incision. You have fluid or blood coming from your incision. Your incision feels warm to the touch. You have pus or a bad smell coming from your incision. You have pain that does not get better with medicine. Get help right away if: You have chills or a fever. You have severe pain. Summary After the procedure, it is common to have mild pain, swelling, and bruising. Follow instructions from your health care provider about how to take care of your incision. Check your incision area every day for signs of infection. Contact a health care provider if you have more redness, swelling, or pain around your incision. This information is not intended to replace advice given to you by your health care provider. Make sure you discuss any questions you have with your health care provider. Document Revised: 01/02/2019 Document Reviewed: 01/02/2019 Elsevier Patient Education  2022 [employer].  

## 2021-02-17 NOTE — Progress Notes (Signed)
  Procedure Date:  02/17/2021  Pre-operative Diagnosis:  Right lower back lipoma  Post-operative Diagnosis:  Right lower back lipoma  Procedure:  Excision of right lower back lipoma  Surgeon:  Melvyn Neth, MD  Anesthesia:  15 ml of 1% lidocaine with epi  Estimated Blood Loss:  10 ml  Specimens:  lipoma, in fragments  Complications:  None  Indications for Procedure:  This is a 80 y.o. female with diagnosis of a symptomatic right lower back lipoma, possibly episacral lipoma.  The patient wishes to have this excised. The risks of bleeding, abscess or infection, injury to surrounding structures, and need for further procedures were all discussed with the patient and she was willing to proceed.  Description of Procedure: The patient was correctly identified at bedside.  The patient was placed in prone position.  Appropriate time-outs were performed.  The patient's right lower back was prepped and draped in usual sterile fashion.  Local anesthetic was infused intradermally.  A 4 cm incision was made over the lipoma, and scalpel was used to dissect down the skin and subcutaneous tissue.  Skin flaps were created sharply, and then the lipoma was excised in multiple fragments.  It was sent off to pathology.  Overall the total specimen measured more than 3 cm.  The cavity was then irrigated and hemostasis was assured with manual pressure.  The wound was then closed in multiple layers using 3-0 Vicryl and 4-0 Monocryl.  The incision was cleaned and sealed with DermaBond.  The patient tolerated the procedure well and all sharps were appropriately disposed of at the end of the case.  --Patient may take tylenol or ibuprofen for pain --may apply ice pack over the wound for comfort --May apply dry gauze dressing over the wound to protect if her pants/underwear line up over the incision. --Follow up next week for wound check with Mr. Olean Ree.   Melvyn Neth, MD

## 2021-02-25 ENCOUNTER — Other Ambulatory Visit: Payer: Self-pay

## 2021-02-25 ENCOUNTER — Encounter: Payer: PPO | Admitting: Physician Assistant

## 2021-02-25 ENCOUNTER — Encounter: Payer: Self-pay | Admitting: Physician Assistant

## 2021-02-25 ENCOUNTER — Ambulatory Visit (INDEPENDENT_AMBULATORY_CARE_PROVIDER_SITE_OTHER): Payer: PPO | Admitting: Physician Assistant

## 2021-02-25 VITALS — BP 138/73 | HR 87 | Temp 98.6°F | Ht 66.0 in | Wt 175.0 lb

## 2021-02-25 DIAGNOSIS — Z09 Encounter for follow-up examination after completed treatment for conditions other than malignant neoplasm: Secondary | ICD-10-CM

## 2021-02-25 DIAGNOSIS — D171 Benign lipomatous neoplasm of skin and subcutaneous tissue of trunk: Secondary | ICD-10-CM

## 2021-02-25 NOTE — Progress Notes (Addendum)
[facility] POST-OP OFFICE VISIT  02/25/2021  HPI: Felicia Romero is a 80 y.o. female 8 days s/p lipoma removal from her right lower back with Dr Hampton Abbot  She has done very well since her procedure She reports that she is finally feeling pain free, especially compared to how she was feeling before hand.  No fever, chills No issues with the incision No other complaints   Vital signs: BP 138/73   Pulse 87   Temp 98.6 F (37 C)   Ht 5\' 6"  (1.676 m)   Wt 175 lb (79.4 kg)   SpO2 96%   BMI 28.25 kg/m    Physical Exam: Constitutional: Well appearing female, NAD Skin: 4 cm incision to the lower back right of midline, this is well healed, remaining dermabond is falling off and removed, no erythema or drainage   Assessment/Plan: This is a 80 y.o. female 8 days s/p lipoma removal from her right lower back   - Pain control as needed  - Reviewed wound care   - Reviewed pathology: Lipoma  - She can rtc on as needed basis   -- Edison Simon, PA-C [facility] 02/25/2021, 3:50 PM [phone] M-F: 7am - 4pm

## 2021-02-25 NOTE — Patient Instructions (Addendum)
The bruising will fade with time. The area looks very good.  Follow-up with our office as needed.  Please call and ask to speak with a nurse if you develop questions or concerns.

## 2021-03-31 DIAGNOSIS — F419 Anxiety disorder, unspecified: Secondary | ICD-10-CM | POA: Diagnosis not present

## 2021-03-31 DIAGNOSIS — F32A Depression, unspecified: Secondary | ICD-10-CM | POA: Diagnosis not present

## 2021-04-29 DIAGNOSIS — F32A Depression, unspecified: Secondary | ICD-10-CM | POA: Diagnosis not present

## 2021-04-29 DIAGNOSIS — F419 Anxiety disorder, unspecified: Secondary | ICD-10-CM | POA: Diagnosis not present

## 2022-01-15 ENCOUNTER — Other Ambulatory Visit: Payer: Self-pay | Admitting: Internal Medicine

## 2022-01-15 ENCOUNTER — Ambulatory Visit
Admission: RE | Admit: 2022-01-15 | Discharge: 2022-01-15 | Disposition: A | Discharge status: Home / Self Care | Payer: PPO | Source: Ambulatory Visit | Attending: Internal Medicine | Admitting: Internal Medicine

## 2022-01-15 ENCOUNTER — Ambulatory Visit
Admission: RE | Admit: 2022-01-15 | Discharge: 2022-01-15 | Disposition: A | Discharge status: Home / Self Care | Payer: PPO | Attending: Diagnostic Radiology | Admitting: Diagnostic Radiology

## 2022-01-15 DIAGNOSIS — M25562 Pain in left knee: Secondary | ICD-10-CM | POA: Diagnosis not present

## 2022-01-15 DIAGNOSIS — R52 Pain, unspecified: Secondary | ICD-10-CM

## 2022-01-15 DIAGNOSIS — M25462 Effusion, left knee: Secondary | ICD-10-CM | POA: Insufficient documentation

## 2022-01-15 DIAGNOSIS — W19XXXA Unspecified fall, initial encounter: Secondary | ICD-10-CM | POA: Insufficient documentation

## 2022-01-15 DIAGNOSIS — Z96652 Presence of left artificial knee joint: Secondary | ICD-10-CM | POA: Diagnosis not present

## 2022-02-27 ENCOUNTER — Encounter: Payer: Self-pay | Admitting: Ophthalmology

## 2022-02-27 ENCOUNTER — Other Ambulatory Visit: Payer: Self-pay

## 2022-03-06 NOTE — Discharge Instructions (Signed)

## 2022-03-09 ENCOUNTER — Encounter
Admission: RE | Disposition: A | Discharge status: Home / Self Care | Payer: Self-pay | Source: Home / Self Care | Attending: Ophthalmology

## 2022-03-09 ENCOUNTER — Encounter: Payer: Self-pay | Admitting: Ophthalmology

## 2022-03-09 ENCOUNTER — Ambulatory Visit: Payer: PPO | Admitting: Anesthesiology

## 2022-03-09 ENCOUNTER — Other Ambulatory Visit: Payer: Self-pay

## 2022-03-09 ENCOUNTER — Ambulatory Visit
Admission: RE | Admit: 2022-03-09 | Discharge: 2022-03-09 | Disposition: A | Discharge status: Home / Self Care | Payer: PPO | Attending: Ophthalmology | Admitting: Ophthalmology

## 2022-03-09 DIAGNOSIS — M1990 Unspecified osteoarthritis, unspecified site: Secondary | ICD-10-CM | POA: Diagnosis not present

## 2022-03-09 DIAGNOSIS — F32A Depression, unspecified: Secondary | ICD-10-CM | POA: Insufficient documentation

## 2022-03-09 DIAGNOSIS — H2511 Age-related nuclear cataract, right eye: Secondary | ICD-10-CM | POA: Insufficient documentation

## 2022-03-09 HISTORY — PX: CATARACT EXTRACTION W/PHACO: SHX586

## 2022-03-09 HISTORY — DX: Malignant (primary) neoplasm, unspecified: C80.1

## 2022-03-09 SURGERY — PHACOEMULSIFICATION, CATARACT, WITH IOL INSERTION
Anesthesia: Monitor Anesthesia Care | Site: Eye | Laterality: Right

## 2022-03-09 MED ORDER — SIGHTPATH DOSE#1 BSS IO SOLN
INTRAOCULAR | Status: DC | PRN
  Administered 2022-03-09: 66 mL via OPHTHALMIC

## 2022-03-09 MED ORDER — FENTANYL CITRATE (PF) 100 MCG/2ML IJ SOLN
INTRAMUSCULAR | Status: DC | PRN
  Administered 2022-03-09 (×2): 50 ug via INTRAVENOUS

## 2022-03-09 MED ORDER — LIDOCAINE HCL (PF) 2 % IJ SOLN
INTRAOCULAR | Status: DC | PRN
  Administered 2022-03-09: 1 mL via INTRAOCULAR

## 2022-03-09 MED ORDER — MIDAZOLAM HCL 2 MG/2ML IJ SOLN
INTRAMUSCULAR | Status: DC | PRN
  Administered 2022-03-09 (×2): 1 mg via INTRAVENOUS

## 2022-03-09 MED ORDER — SIGHTPATH DOSE#1 BSS IO SOLN
INTRAOCULAR | Status: DC | PRN
  Administered 2022-03-09: 15 mL

## 2022-03-09 MED ORDER — ARMC OPHTHALMIC DILATING DROPS
1.0000 | OPHTHALMIC | Status: DC | PRN
  Administered 2022-03-09 (×3): 1 via OPHTHALMIC

## 2022-03-09 MED ORDER — TETRACAINE HCL 0.5 % OP SOLN
1.0000 [drp] | OPHTHALMIC | Status: DC | PRN
  Administered 2022-03-09 (×3): 1 [drp] via OPHTHALMIC

## 2022-03-09 MED ORDER — SIGHTPATH DOSE#1 NA HYALUR & NA CHOND-NA HYALUR IO KIT
PACK | INTRAOCULAR | Status: DC | PRN
  Administered 2022-03-09: 1 via OPHTHALMIC

## 2022-03-09 MED ORDER — MOXIFLOXACIN HCL 0.5 % OP SOLN
OPHTHALMIC | Status: DC | PRN
  Administered 2022-03-09: 0.2 mL via OPHTHALMIC

## 2022-03-09 SURGICAL SUPPLY — 13 items

## 2022-03-09 NOTE — H&P (Signed)
[facility]   Primary Care Physician:  [facility] Primary Ophthalmologist: Dr. Benay Pillow  Pre-Procedure History & Physical: HPI:  Felicia Romero is a 81 y.o. female here for cataract surgery.   Past Medical History:  Diagnosis Date   Arthritis    Ductal carcinoma (HCC)    Hernia, epigastric     Past Surgical History:  Procedure Laterality Date   ABDOMINAL HYSTERECTOMY  1994   BREAST BIOPSY Left 08/31/1971   neg   BREAST LUMPECTOMY Right    COLONOSCOPY W/ BIOPSIES  09/2015   TONSILLECTOMY  1950   TOTAL KNEE ARTHROPLASTY Bilateral 2016    Prior to Admission medications   Medication Sig Start Date End Date Taking? Authorizing Provider  acetaminophen (TYLENOL) 500 MG tablet Take 2 tablets (1,000 mg total) by mouth 2 (two) times daily. 08/12/16  Yes Plonk, Gwyndolyn Saxon, MD  Ascorbic Acid (VITAMIN C) 1000 MG tablet Take 1,000 mg by mouth daily.   Yes [provider]  Biotin 5000 MCG TABS Take 2,000 mcg by mouth.    Yes [provider]  Calcium-Magnesium 500-250 MG TABS Take by mouth.   Yes [provider]  escitalopram (LEXAPRO) 10 MG tablet Take 10 mg by mouth daily.   Yes [provider]  letrozole (FEMARA) 2.5 MG tablet Take 2.5 mg by mouth daily.   Yes [provider]  Multiple Vitamin (MULTIVITAMIN) tablet Take 1 tablet by mouth daily.   Yes [provider]  VITAMIN D, CHOLECALCIFEROL, PO Take 1,000 Units by mouth daily.   Yes [provider]  celecoxib (CELEBREX) 100 MG capsule Take 100 mg by mouth daily as needed. Patient not taking: Reported on 02/27/2022    [provider]  ketoconazole (NIZORAL) 2 % cream Apply 1 application topically daily.    [provider]    Allergies as of 02/13/2022   (No Known Allergies)    Family History  Problem Relation Age of Onset   Breast cancer Maternal Aunt 44   CAD Mother    Stroke Mother    Prostate cancer Father    Thyroid cancer  Daughter     Social History   Socioeconomic History   Marital status: Married    Spouse name: Not on file   Number of children: Not on file   Years of education: Not on file   Highest education level: Not on file  Occupational History   Not on file  Tobacco Use   Smoking status: Never   Smokeless tobacco: Never  Substance and Sexual Activity   Alcohol use: No   Drug use: No   Sexual activity: Not on file  Other Topics Concern   Not on file  Social History Narrative   Not on file   Social Determinants of Health   Financial Resource Strain: Not on file  Food Insecurity: Not on file  Transportation Needs: Not on file  Physical Activity: Not on file  Stress: Not on file  Social Connections: Not on file  Intimate Partner Violence: Not on file    Review of Systems: See HPI, otherwise negative ROS  Physical Exam: BP (!) 179/79   Pulse 74   Temp 98.2 F (36.8 C) (Temporal)   Ht '5\' 6"'$  (1.676 m)   Wt 74.1 kg   SpO2 97%   BMI 26.36 kg/m  General:   Alert, cooperative in NAD Head:  Normocephalic and atraumatic. Respiratory:  Normal work of breathing. Cardiovascular:  RRR  Impression/Plan: [employer]  Romero is here for cataract surgery.  Risks, benefits, limitations, and alternatives regarding cataract surgery have been reviewed with the patient.  Questions have been answered.  All parties agreeable.   Benay Pillow, MD  03/09/2022, 1:08 PM

## 2022-03-09 NOTE — Op Note (Signed)
OPERATIVE NOTE  Analyse Angst [id number] 03/09/2022   PREOPERATIVE DIAGNOSIS:  Nuclear sclerotic cataract right eye.  H25.11   POSTOPERATIVE DIAGNOSIS:    Nuclear sclerotic cataract right eye.     PROCEDURE:  Phacoemusification with posterior chamber intraocular lens placement of the right eye   LENS:   Implant Name Type Inv. Item Serial No. Manufacturer Lot No. LRB No. Used Action  LENS IOL TECNIS EYHANCE 20.0 - [id number] Intraocular Lens LENS IOL TECNIS EYHANCE 20.0 [id number] [employer]  Right 1 Implanted       Procedure(s) with comments: CATARACT EXTRACTION PHACO AND INTRAOCULAR LENS PLACEMENT (IOC) RIGHT (Right) - 5.01 00:36.2  DIB00 +20.0   ULTRASOUND TIME: 0 minutes 36 seconds.  CDE 5.01   SURGEON:  Benay Pillow, MD, MPH  ANESTHESIOLOGIST: Anesthesiologist: Ilene Qua, MD CRNA: Moises Blood, CRNA   ANESTHESIA:  Topical with tetracaine drops augmented with 1% preservative-free intracameral lidocaine.  ESTIMATED BLOOD LOSS: less than 1 mL.   COMPLICATIONS:  None.   DESCRIPTION OF PROCEDURE:  The patient was identified in the holding room and transported to the operating room and placed in the supine position under the operating microscope.  The right eye was identified as the operative eye and it was prepped and draped in the usual sterile ophthalmic fashion.   A 1.0 millimeter clear-corneal paracentesis was made at the 10:30 position. 0.5 ml of preservative-free 1% lidocaine with epinephrine was injected into the anterior chamber.  The anterior chamber was filled with viscoelastic.  A 2.4 millimeter keratome was used to make a near-clear corneal incision at the 8:00 position.  A curvilinear capsulorrhexis was made with a cystotome and capsulorrhexis forceps.  Balanced salt solution was used to hydrodissect and hydrodelineate the nucleus.   Phacoemulsification was then used in stop and chop fashion to remove the lens nucleus and epinucleus.  The  remaining cortex was then removed using the irrigation and aspiration handpiece. Viscoelastic was then placed into the capsular bag to distend it for lens placement.  A lens was then injected into the capsular bag.  The remaining viscoelastic was aspirated.   Wounds were hydrated with balanced salt solution.  The anterior chamber was inflated to a physiologic pressure with balanced salt solution.   Intracameral vigamox 0.1 mL undiluted was injected into the eye and a drop placed onto the ocular surface.  No wound leaks were noted.  The patient was taken to the recovery room in stable condition without complications of anesthesia or surgery  Benay Pillow 03/09/2022, 1:36 PM

## 2022-03-09 NOTE — Anesthesia Postprocedure Evaluation (Signed)
Anesthesia Post Note  Patient: Forensic scientist  Procedure(s) Performed: CATARACT EXTRACTION PHACO AND INTRAOCULAR LENS PLACEMENT (IOC) RIGHT (Right: Eye)  Patient location during evaluation: PACU Anesthesia Type: MAC Level of consciousness: awake and alert Pain management: pain level controlled Vital Signs Assessment: post-procedure vital signs reviewed and stable Respiratory status: spontaneous breathing, nonlabored ventilation, respiratory function stable and patient connected to nasal cannula oxygen Cardiovascular status: stable and blood pressure returned to baseline Postop Assessment: no apparent nausea or vomiting Anesthetic complications: no   No notable events documented.   Last Vitals:  Vitals:   03/09/22 1338 03/09/22 1345  BP: 118/66 122/62  Pulse: 63 60  Resp: 11 17  Temp: (!) 36.2 C (!) 36.2 C  SpO2: 98% 97%    Last Pain:  Vitals:   03/09/22 1345  TempSrc:   PainSc: 0-No pain                 Ilene Qua

## 2022-03-09 NOTE — Anesthesia Preprocedure Evaluation (Signed)
Anesthesia Evaluation  Patient identified by MRN, date of birth, ID band Patient awake    Reviewed: Allergy & Precautions, NPO status , Patient's Chart, lab work & pertinent test results  History of Anesthesia Complications Negative for: history of anesthetic complications  Airway Mallampati: II  TM Distance: <3 FB Neck ROM: full    Dental  (+) Chipped, Teeth Intact   Pulmonary neg pulmonary ROS,    Pulmonary exam normal        Cardiovascular Exercise Tolerance: Good negative cardio ROS Normal cardiovascular exam     Neuro/Psych PSYCHIATRIC DISORDERS Depression negative neurological ROS     GI/Hepatic negative GI ROS, Neg liver ROS,   Endo/Other  negative endocrine ROS  Renal/GU      Musculoskeletal   Abdominal   Peds  Hematology negative hematology ROS (+)   Anesthesia Other Findings Past Medical History: No date: Arthritis No date: Ductal carcinoma (HCC) No date: Hernia, epigastric  Past Surgical History: 1994: ABDOMINAL HYSTERECTOMY 08/31/1971: BREAST BIOPSY; Left     Comment:  neg No date: BREAST LUMPECTOMY; Right 09/2015: COLONOSCOPY W/ BIOPSIES 1950: TONSILLECTOMY 2016: TOTAL KNEE ARTHROPLASTY; Bilateral  BMI    Body Mass Index: 25.34 kg/m      Reproductive/Obstetrics negative OB ROS                             Anesthesia Physical Anesthesia Plan  ASA: 2  Anesthesia Plan: MAC   Post-op Pain Management: Minimal or no pain anticipated   Induction: Intravenous  PONV Risk Score and Plan:   Airway Management Planned: Natural Airway and Nasal Cannula  Additional Equipment:   Intra-op Plan:   Post-operative Plan:   Informed Consent: I have reviewed the patients History and Physical, chart, labs and discussed the procedure including the risks, benefits and alternatives for the proposed anesthesia with the patient or authorized representative who has indicated  his/her understanding and acceptance.     Dental Advisory Given  Plan Discussed with: Anesthesiologist, CRNA and Surgeon  Anesthesia Plan Comments: (Patient consented for risks of anesthesia including but not limited to:  - adverse reactions to medications - damage to eyes, teeth, lips or other oral mucosa - nerve damage due to positioning  - sore throat or hoarseness - Damage to heart, brain, nerves, lungs, other parts of body or loss of life  Patient voiced understanding.)        Anesthesia Quick Evaluation

## 2022-03-09 NOTE — Transfer of Care (Signed)
Immediate Anesthesia Transfer of Care Note  Patient: Felicia Romero  Procedure(s) Performed: CATARACT EXTRACTION PHACO AND INTRAOCULAR LENS PLACEMENT (IOC) RIGHT (Right: Eye)  Patient Location: PACU  Anesthesia Type: MAC  Level of Consciousness: awake, alert  and patient cooperative  Airway and Oxygen Therapy: Patient Spontanous Breathing and Patient connected to supplemental oxygen  Post-op Assessment: Post-op Vital signs reviewed, Patient's Cardiovascular Status Stable, Respiratory Function Stable, Patent Airway and No signs of Nausea or vomiting  Post-op Vital Signs: Reviewed and stable  Complications: No notable events documented.

## 2022-03-10 ENCOUNTER — Encounter: Payer: Self-pay | Admitting: Ophthalmology

## 2022-03-13 ENCOUNTER — Encounter: Payer: Self-pay | Admitting: Ophthalmology

## 2022-03-20 NOTE — Discharge Instructions (Signed)

## 2022-03-20 NOTE — Anesthesia Preprocedure Evaluation (Signed)
Anesthesia Evaluation  Patient identified by MRN, date of birth, ID band Patient awake    Reviewed: Allergy & Precautions, NPO status , Patient's Chart, lab work & pertinent test results  History of Anesthesia Complications Negative for: history of anesthetic complications  Airway Mallampati: II  TM Distance: <3 FB Neck ROM: full    Dental  (+) Teeth Intact, Missing   Pulmonary neg pulmonary ROS,    Pulmonary exam normal        Cardiovascular Exercise Tolerance: Good negative cardio ROS Normal cardiovascular exam     Neuro/Psych PSYCHIATRIC DISORDERS Depression negative neurological ROS     GI/Hepatic negative GI ROS, Neg liver ROS,   Endo/Other  negative endocrine ROS  Renal/GU      Musculoskeletal   Abdominal Normal abdominal exam  (+)   Peds  Hematology negative hematology ROS (+)   Anesthesia Other Findings Past Medical History: No date: Arthritis No date: Ductal carcinoma (HCC) No date: Hernia, epigastric  Past Surgical History: 1994: ABDOMINAL HYSTERECTOMY 08/31/1971: BREAST BIOPSY; Left     Comment:  neg No date: BREAST LUMPECTOMY; Right 09/2015: COLONOSCOPY W/ BIOPSIES 1950: TONSILLECTOMY 2016: TOTAL KNEE ARTHROPLASTY; Bilateral  BMI    Body Mass Index: 25.34 kg/m      Reproductive/Obstetrics negative OB ROS                            Anesthesia Physical  Anesthesia Plan  ASA: 2  Anesthesia Plan: MAC   Post-op Pain Management: Minimal or no pain anticipated   Induction: Intravenous  PONV Risk Score and Plan:   Airway Management Planned: Natural Airway and Nasal Cannula  Additional Equipment:   Intra-op Plan:   Post-operative Plan:   Informed Consent: I have reviewed the patients History and Physical, chart, labs and discussed the procedure including the risks, benefits and alternatives for the proposed anesthesia with the patient or authorized  representative who has indicated his/her understanding and acceptance.     Dental Advisory Given  Plan Discussed with: Anesthesiologist, CRNA and Surgeon  Anesthesia Plan Comments: (Patient consented for risks of anesthesia including but not limited to:  - adverse reactions to medications - damage to eyes, teeth, lips or other oral mucosa - nerve damage due to positioning  - sore throat or hoarseness - Damage to heart, brain, nerves, lungs, other parts of body or loss of life  Patient voiced understanding.)       Anesthesia Quick Evaluation

## 2022-03-23 ENCOUNTER — Ambulatory Visit
Admission: RE | Admit: 2022-03-23 | Discharge: 2022-03-23 | Disposition: A | Discharge status: Home / Self Care | Payer: PPO | Attending: Ophthalmology | Admitting: Ophthalmology

## 2022-03-23 ENCOUNTER — Ambulatory Visit: Payer: PPO | Admitting: Anesthesiology

## 2022-03-23 ENCOUNTER — Encounter: Payer: Self-pay | Admitting: Ophthalmology

## 2022-03-23 ENCOUNTER — Other Ambulatory Visit: Payer: Self-pay

## 2022-03-23 ENCOUNTER — Encounter
Admission: RE | Disposition: A | Discharge status: Home / Self Care | Payer: Self-pay | Source: Home / Self Care | Attending: Ophthalmology

## 2022-03-23 DIAGNOSIS — F32A Depression, unspecified: Secondary | ICD-10-CM | POA: Insufficient documentation

## 2022-03-23 DIAGNOSIS — H2512 Age-related nuclear cataract, left eye: Secondary | ICD-10-CM | POA: Diagnosis present

## 2022-03-23 HISTORY — PX: CATARACT EXTRACTION W/PHACO: SHX586

## 2022-03-23 SURGERY — PHACOEMULSIFICATION, CATARACT, WITH IOL INSERTION
Anesthesia: Monitor Anesthesia Care | Site: Eye | Laterality: Left

## 2022-03-23 MED ORDER — SIGHTPATH DOSE#1 NA HYALUR & NA CHOND-NA HYALUR IO KIT
PACK | INTRAOCULAR | Status: DC | PRN
  Administered 2022-03-23: 1 via OPHTHALMIC

## 2022-03-23 MED ORDER — SIGHTPATH DOSE#1 BSS IO SOLN
INTRAOCULAR | Status: DC | PRN
  Administered 2022-03-23: 15 mL

## 2022-03-23 MED ORDER — FENTANYL CITRATE (PF) 100 MCG/2ML IJ SOLN
INTRAMUSCULAR | Status: DC | PRN
  Administered 2022-03-23: 100 ug via INTRAVENOUS

## 2022-03-23 MED ORDER — SIGHTPATH DOSE#1 BSS IO SOLN
INTRAOCULAR | Status: DC | PRN
  Administered 2022-03-23: 99 mL via OPHTHALMIC

## 2022-03-23 MED ORDER — MOXIFLOXACIN HCL 0.5 % OP SOLN
OPHTHALMIC | Status: DC | PRN
  Administered 2022-03-23: 0.2 mL via OPHTHALMIC

## 2022-03-23 MED ORDER — TETRACAINE HCL 0.5 % OP SOLN
1.0000 [drp] | OPHTHALMIC | Status: AC | PRN
  Administered 2022-03-23 (×3): 1 [drp] via OPHTHALMIC

## 2022-03-23 MED ORDER — ARMC OPHTHALMIC DILATING DROPS
1.0000 | OPHTHALMIC | Status: AC | PRN
  Administered 2022-03-23 (×3): 1 via OPHTHALMIC

## 2022-03-23 MED ORDER — MIDAZOLAM HCL 2 MG/2ML IJ SOLN
INTRAMUSCULAR | Status: DC | PRN
  Administered 2022-03-23: 2 mg via INTRAVENOUS

## 2022-03-23 MED ORDER — LIDOCAINE HCL (PF) 2 % IJ SOLN
INTRAOCULAR | Status: DC | PRN
  Administered 2022-03-23: 1 mL via INTRAOCULAR

## 2022-03-23 SURGICAL SUPPLY — 20 items
CANNULA ANT/CHMB 27G (MISCELLANEOUS) IMPLANT
CANNULA ANT/CHMB 27GA (MISCELLANEOUS) IMPLANT
CATARACT SUITE SIGHTPATH (MISCELLANEOUS) ×1 IMPLANT
DISSECTOR HYDRO NUCLEUS 50X22 (MISCELLANEOUS) ×1 IMPLANT
FEE CATARACT SUITE SIGHTPATH (MISCELLANEOUS) ×1 IMPLANT
GLOVE SURG GAMMEX PI TX LF 7.5 (GLOVE) ×1 IMPLANT
GLOVE SURG SYN 8.5  E (GLOVE) ×1
GLOVE SURG SYN 8.5 E (GLOVE) ×1 IMPLANT
GLOVE SURG SYN 8.5 PF PI (GLOVE) ×1 IMPLANT
KIT SLEEVE INFUSION .9 MICRO (MISCELLANEOUS) IMPLANT
LENS IOL TECNIS EYHANCE 20.0 (Intraocular Lens) IMPLANT
NDL FILTER BLUNT 18X1 1/2 (NEEDLE) ×1 IMPLANT
NEEDLE FILTER BLUNT 18X1 1/2 (NEEDLE) ×1 IMPLANT
PACK VIT ANT 23G (MISCELLANEOUS) IMPLANT
RING MALYGIN (MISCELLANEOUS) IMPLANT
SUT ETHILON 10-0 CS-B-6CS-B-6 (SUTURE)
SUTURE EHLN 10-0 CS-B-6CS-B-6 (SUTURE) IMPLANT
SYR 3ML LL SCALE MARK (SYRINGE) ×1 IMPLANT
SYR 5ML LL (SYRINGE) ×1 IMPLANT
WATER STERILE IRR 250ML POUR (IV SOLUTION) ×1 IMPLANT

## 2022-03-23 NOTE — Transfer of Care (Signed)
Immediate Anesthesia Transfer of Care Note  Patient: Felicia Romero  Procedure(s) Performed: CATARACT EXTRACTION PHACO AND INTRAOCULAR LENS PLACEMENT (IOC) LEFT (Left: Eye)  Patient Location: PACU  Anesthesia Type: MAC  Level of Consciousness: awake, alert  and patient cooperative  Airway and Oxygen Therapy: Patient Spontanous Breathing and Patient connected to supplemental oxygen  Post-op Assessment: Post-op Vital signs reviewed, Patient's Cardiovascular Status Stable, Respiratory Function Stable, Patent Airway and No signs of Nausea or vomiting  Post-op Vital Signs: Reviewed and stable  Complications: There were no known notable events for this encounter.

## 2022-03-23 NOTE — Op Note (Signed)
OPERATIVE NOTE  Felicia Romero [id number] 03/23/2022   PREOPERATIVE DIAGNOSIS:  Nuclear sclerotic cataract left eye.  H25.12   POSTOPERATIVE DIAGNOSIS:    Nuclear sclerotic cataract left eye.     PROCEDURE:  Phacoemusification with posterior chamber intraocular lens placement of the left eye   LENS:   Implant Name Type Inv. Item Serial No. Manufacturer Lot No. LRB No. Used Action  LENS IOL TECNIS EYHANCE 20.0 - [id number] Intraocular Lens LENS IOL TECNIS EYHANCE 20.0 [id number] [employer]  Left 1 Implanted      Procedure(s) with comments: CATARACT EXTRACTION PHACO AND INTRAOCULAR LENS PLACEMENT (IOC) LEFT (Left) - 10.47 0:56.1   SURGEON:  Benay Pillow, MD, MPH   ANESTHESIA:  Topical with tetracaine drops augmented with 1% preservative-free intracameral lidocaine.  ESTIMATED BLOOD LOSS: <1 mL   COMPLICATIONS:  None.   DESCRIPTION OF PROCEDURE:  The patient was identified in the holding room and transported to the operating room and placed in the supine position under the operating microscope.  The left eye was identified as the operative eye and it was prepped and draped in the usual sterile ophthalmic fashion.   A 1.0 millimeter clear-corneal paracentesis was made at the 5:00 position. 0.5 ml of preservative-free 1% lidocaine with epinephrine was injected into the anterior chamber.  The anterior chamber was filled with viscoelastic.  A 2.4 millimeter keratome was used to make a near-clear corneal incision at the 2:00 position.  A curvilinear capsulorrhexis was made with a cystotome and capsulorrhexis forceps.  Balanced salt solution was used to hydrodissect and hydrodelineate the nucleus.   Phacoemulsification was then used in stop and chop fashion to remove the lens nucleus and epinucleus.  The remaining cortex was then removed using the irrigation and aspiration handpiece. Viscoelastic was then placed into the capsular bag to distend it for lens placement.  A lens was  then injected into the capsular bag.  The remaining viscoelastic was aspirated.   Wounds were hydrated with balanced salt solution.  The anterior chamber was inflated to a physiologic pressure with balanced salt solution.  Intracameral vigamox 0.1 mL undiltued was injected into the eye and a drop placed onto the ocular surface.  No wound leaks were noted.  The patient was taken to the recovery room in stable condition without complications of anesthesia or surgery  Benay Pillow 03/23/2022, 9:01 AM

## 2022-03-23 NOTE — H&P (Signed)
Felicia Romero   Primary Care Physician:  [facility] Primary Ophthalmologist: Dr. Benay Pillow  Pre-Procedure History & Physical: HPI:  Felicia Romero is a 81 y.o. female here for cataract surgery.   Past Medical History:  Diagnosis Date   Arthritis    Ductal carcinoma (HCC)    Hernia, epigastric     Past Surgical History:  Procedure Laterality Date   ABDOMINAL HYSTERECTOMY  1994   BREAST BIOPSY Left 08/31/1971   neg   BREAST LUMPECTOMY Right    CATARACT EXTRACTION W/PHACO Right 03/09/2022   Procedure: CATARACT EXTRACTION PHACO AND INTRAOCULAR LENS PLACEMENT (IOC) RIGHT;  Surgeon: Eulogio Bear, MD;  Location: [place];  Service: Ophthalmology;  Laterality: Right;  5.01 00:36.2   COLONOSCOPY W/ BIOPSIES  09/2015   TONSILLECTOMY  1950   TOTAL KNEE ARTHROPLASTY Bilateral 2016    Prior to Admission medications   Medication Sig Start Date End Date Taking? Authorizing Provider  acetaminophen (TYLENOL) 500 MG tablet Take 2 tablets (1,000 mg total) by mouth 2 (two) times daily. 08/12/16  Yes Plonk, Gwyndolyn Saxon, MD  Ascorbic Acid (VITAMIN C) 1000 MG tablet Take 1,000 mg by mouth daily.   Yes [provider]  Biotin 5000 MCG TABS Take 2,000 mcg by mouth.    Yes [provider]  Calcium-Magnesium 500-250 MG TABS Take by mouth.   Yes [provider]  escitalopram (LEXAPRO) 10 MG tablet Take 10 mg by mouth daily.   Yes [provider]  ketoconazole (NIZORAL) 2 % cream Apply 1 application topically daily.   Yes [provider]  letrozole (FEMARA) 2.5 MG tablet Take 2.5 mg by mouth daily.   Yes [provider]  Multiple Vitamin (MULTIVITAMIN) tablet Take 1 tablet by mouth daily.   Yes [provider]  VITAMIN D, CHOLECALCIFEROL, PO Take 1,000 Units by mouth daily.   Yes [provider]  celecoxib (CELEBREX) 100 MG capsule Take 100 mg by mouth daily as needed. Patient not taking: Reported on  02/27/2022    [provider]    Allergies as of 02/13/2022   (No Known Allergies)    Family History  Problem Relation Age of Onset   Breast cancer Maternal Aunt 3   CAD Mother    Stroke Mother    Prostate cancer Father    Thyroid cancer Daughter     Social History   Socioeconomic History   Marital status: Married    Spouse name: Not on file   Number of children: Not on file   Years of education: Not on file   Highest education level: Not on file  Occupational History   Not on file  Tobacco Use   Smoking status: Never   Smokeless tobacco: Never  Substance and Sexual Activity   Alcohol use: No   Drug use: No   Sexual activity: Not on file  Other Topics Concern   Not on file  Social History Narrative   Not on file   Social Determinants of Health   Financial Resource Strain: Not on file  Food Insecurity: Not on file  Transportation Needs: Not on file  Physical Activity: Not on file  Stress: Not on file  Social Connections: Not on file  Intimate Partner Violence: Not on file    Review of Systems: See HPI, otherwise negative ROS  Physical Exam: BP (!) 144/75   Pulse 77   Temp 97.8 F (36.6 C) (Temporal)   Resp 16   Ht 5'  6" (1.676 m)   Wt 73.9 kg   SpO2 97%   BMI 26.31 kg/m  General:   Alert, cooperative in NAD Head:  Normocephalic and atraumatic. Respiratory:  Normal work of breathing. Cardiovascular:  RRR  Impression/Plan: Felicia Romero is here for cataract surgery.  Risks, benefits, limitations, and alternatives regarding cataract surgery have been reviewed with the patient.  Questions have been answered.  All parties agreeable.   Benay Pillow, MD  03/23/2022, 8:30 AM

## 2022-03-23 NOTE — Anesthesia Postprocedure Evaluation (Signed)
Anesthesia Post Note  Patient: Forensic scientist  Procedure(s) Performed: CATARACT EXTRACTION PHACO AND INTRAOCULAR LENS PLACEMENT (IOC) LEFT (Left: Eye)  Patient location during evaluation: PACU Anesthesia Type: MAC Level of consciousness: awake and alert Pain management: pain level controlled Vital Signs Assessment: post-procedure vital signs reviewed and stable Respiratory status: spontaneous breathing, nonlabored ventilation and respiratory function stable Cardiovascular status: blood pressure returned to baseline and stable Postop Assessment: no apparent nausea or vomiting Anesthetic complications: no   There were no known notable events for this encounter.   Last Vitals:  Vitals:   03/23/22 0902 03/23/22 0907  BP: 120/67 109/64  Pulse: 64 65  Resp: 14 14  Temp: (!) 36.2 C (!) 36.2 C  SpO2: 97% 95%    Last Pain:  Vitals:   03/23/22 0907  TempSrc:   PainSc: 0-No pain                 [place] Ouch

## 2022-03-24 ENCOUNTER — Encounter: Payer: Self-pay | Admitting: Ophthalmology

## 2022-07-13 ENCOUNTER — Encounter: Payer: Self-pay | Admitting: Surgery

## 2022-07-13 ENCOUNTER — Ambulatory Visit (INDEPENDENT_AMBULATORY_CARE_PROVIDER_SITE_OTHER): Payer: PPO | Admitting: Surgery

## 2022-07-13 VITALS — BP 135/70 | HR 104 | Temp 98.0°F | Ht 66.0 in | Wt 175.0 lb

## 2022-07-13 DIAGNOSIS — D171 Benign lipomatous neoplasm of skin and subcutaneous tissue of trunk: Secondary | ICD-10-CM

## 2022-07-13 NOTE — Patient Instructions (Signed)
If you have any concerns or questions, please feel free to call our office. See follow up appointment below.   Lipoma  A lipoma is a noncancerous (benign) tumor that is made up of fat cells. This is a very common type of soft-tissue growth. Lipomas are usually found under the skin (subcutaneous). They may occur in any tissue of the body that contains fat. Common areas for lipomas to appear include the back, arms, shoulders, buttocks, and thighs. Lipomas grow slowly, and they are usually painless. Most lipomas do not cause problems and do not require treatment. What are the causes? The cause of this condition is not known. What increases the risk? You are more likely to develop this condition if: You are 33-26 years old. You have a family history of lipomas. What are the signs or symptoms? A lipoma usually appears as a small, round bump under the skin. In most cases, the lump will: Feel soft or rubbery. Not cause pain or other symptoms. However, if a lipoma is located in an area where it pushes on nerves, it can become painful or cause other symptoms. How is this diagnosed? A lipoma can usually be diagnosed with a physical exam. You may also have tests to confirm the diagnosis and to rule out other conditions. Tests may include: Imaging tests, such as a CT scan or an MRI. Removal of a tissue sample to be looked at under a microscope (biopsy). How is this treated? Treatment for this condition depends on the size of the lipoma and whether it is causing any symptoms. For small lipomas that are not causing problems, no treatment is needed. If a lipoma is bigger or it causes problems, surgery may be done to remove the lipoma. Lipomas can also be removed to improve appearance. Most often, the procedure is done after applying a medicine that numbs the area (local anesthetic). Liposuction may be done to reduce the size of the lipoma before it is removed through surgery, or it may be done to remove  the lipoma. Lipomas are removed with this method to limit incision size and scarring. A liposuction tube is inserted through a small incision into the lipoma, and the contents of the lipoma are removed through the tube with suction. Follow these instructions at home: Watch your lipoma for any changes. Keep all follow-up visits. This is important. Where to find more information OrthoInfo: orthoinfo.aaos.org Contact a health care provider if: Your lipoma becomes larger or hard. Your lipoma becomes painful, red, or increasingly swollen. These could be signs of infection or a more serious condition. Get help right away if: You develop tingling or numbness in an area near the lipoma. This could indicate that the lipoma is causing nerve damage. Summary A lipoma is a noncancerous tumor that is made up of fat cells. Most lipomas do not cause problems and do not require treatment. If a lipoma is bigger or it causes problems, surgery may be done to remove the lipoma. Contact a health care provider if your lipoma becomes larger or hard, or if it becomes painful, red, or increasingly swollen. These could be signs of infection or a more serious condition. This information is not intended to replace advice given to you by your health care provider. Make sure you discuss any questions you have with your health care provider. Document Revised: 06/06/2021 Document Reviewed: 06/06/2021 Elsevier Patient Education  [place].

## 2022-07-13 NOTE — Progress Notes (Signed)
07/13/2022  History of Present Illness: Felicia Romero is a 82 y.o. female presenting for evaluation of a possible lipoma in the left lower back.  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  She had been doing well but recently started noticing some discomfort in the left lower back, which is very similar in nature compared to her right lower back discomfort before our procedure.  She denies any radiation of her pain. She has been taking tylenol arthritis which helps.    Past Medical History: Past Medical History:  Diagnosis Date   Arthritis    Ductal carcinoma (HCC)    Hernia, epigastric      Past Surgical History: Past Surgical History:  Procedure Laterality Date   ABDOMINAL HYSTERECTOMY  1994   BREAST BIOPSY Left 08/31/1971   neg   BREAST LUMPECTOMY Right    CATARACT EXTRACTION W/PHACO Right 03/09/2022   Procedure: CATARACT EXTRACTION PHACO AND INTRAOCULAR LENS PLACEMENT ([place]) RIGHT;  Surgeon: Eulogio Bear, MD;  Location: [place];  Service: Ophthalmology;  Laterality: Right;  5.01 00:36.2   CATARACT EXTRACTION W/PHACO Left 03/23/2022   Procedure: CATARACT EXTRACTION PHACO AND INTRAOCULAR LENS PLACEMENT ([place]) LEFT;  Surgeon: Eulogio Bear, MD;  Location: [place];  Service: Ophthalmology;  Laterality: Left;  10.47 0:56.1   COLONOSCOPY W/ BIOPSIES  09/2015   TONSILLECTOMY  1950   TOTAL KNEE ARTHROPLASTY Bilateral 2016    Home Medications: Prior to Admission medications   Medication Sig Start Date End Date Taking? Authorizing Provider  acetaminophen (TYLENOL) 500 MG tablet Take 2 tablets (1,000 mg total) by mouth 2 (two) times daily. 08/12/16  Yes Plonk, Gwyndolyn Saxon, MD  Ascorbic Acid (VITAMIN C) 1000 MG tablet Take 1,000 mg by mouth daily.   Yes [provider]  Biotin 5000 MCG TABS Take 2,000 mcg by mouth.    Yes [provider]  Calcium-Magnesium 500-250 MG TABS Take by mouth.   Yes [provider]  celecoxib (CELEBREX) 100 MG capsule Take 100 mg by mouth daily as needed.   Yes [provider]  escitalopram (LEXAPRO) 10 MG tablet Take 10 mg by mouth daily.   Yes [provider]  letrozole (FEMARA) 2.5 MG tablet Take 2.5 mg by mouth daily.   Yes [provider]  Multiple Vitamin (MULTIVITAMIN) tablet Take 1 tablet by mouth daily.   Yes [provider]  VITAMIN D, CHOLECALCIFEROL, PO Take 1,000 Units by mouth daily.   Yes [provider]    Allergies: No Known Allergies  Review of Systems: Review of Systems  Constitutional:  Negative for chills and fever.  Respiratory:  Negative for shortness of breath.   Cardiovascular:  Negative for chest pain.  Gastrointestinal:  Negative for abdominal pain, nausea and vomiting.  Musculoskeletal:  Positive for back pain.  Skin:        Right lower back mass    Physical Exam BP 135/70   Pulse (!) 104   Temp 98 F (36.7 C) (Oral)   Ht 5' 6"$  (1.676 m)   Wt 175 lb (79.4 kg)   SpO2 94%   BMI 28.25 kg/m  CONSTITUTIONAL: No acute distress, well nourished. HEENT:  Normocephalic, atraumatic, extraocular motion intact. RESPIRATORY:  Normal respiratory effort without pathologic use of accessory muscles. CARDIOVASCULAR: Regular rhythm and rate. BACK:  The patient's right lower back incision is well healed, without palpable masses.  On the left lower back, she has a small 1.5 cm mass  which is tender to palpation, similar to an episacral lipoma. NEUROLOGIC:  Motor and sensation is grossly normal.  Cranial nerves are grossly intact. PSYCH:  Alert and oriented to person, place and time. Affect is normal.   Assessment and Plan: This is a 82 y.o. 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 Discussed with her again how these can cause pain and that our typical management would be excision.  These  could also be injected, but we do not have any steroid or long-acting medications, so would not be of much help.  She would rather proceed with excision as everything went well on the right side and she has not had any issues there since. --Will schedule her for office procedure to excise the mass. --All of her questions have been answered.  I spent 30 minutes dedicated to the care of this patient on the date of this encounter to include pre-visit review of records, face-to-face time with the patient discussing diagnosis and management, and any post-visit coordination of care.   Melvyn Neth, [facility]

## 2022-07-24 ENCOUNTER — Other Ambulatory Visit: Payer: Self-pay | Admitting: Surgery

## 2022-07-24 ENCOUNTER — Ambulatory Visit (INDEPENDENT_AMBULATORY_CARE_PROVIDER_SITE_OTHER): Payer: PPO | Admitting: Surgery

## 2022-07-24 ENCOUNTER — Encounter: Payer: Self-pay | Admitting: Surgery

## 2022-07-24 ENCOUNTER — Other Ambulatory Visit: Payer: Self-pay

## 2022-07-24 VITALS — BP 141/81 | HR 73 | Temp 98.2°F | Ht 66.0 in | Wt 163.0 lb

## 2022-07-24 DIAGNOSIS — D171 Benign lipomatous neoplasm of skin and subcutaneous tissue of trunk: Secondary | ICD-10-CM | POA: Diagnosis not present

## 2022-07-24 NOTE — Patient Instructions (Addendum)
You may take Tylenol and Ibuprofen for the discomfort. You may shower as usual but do not scrub over the area.     Lipoma Removal  Lipoma removal is a surgical procedure to remove a lipoma, which is a noncancerous (benign) tumor that is made up of fat cells. Most lipomas are small and painless and do not require treatment. They can form in many areas of the body but are most common under the skin of the back, arms, shoulders, buttocks, and thighs. You may need lipoma removal if you have a lipoma that is large, growing, or causing discomfort. Lipoma removal may also be done for cosmetic reasons. Tell a health care provider about: Any allergies you have. All medicines you are taking, including vitamins, herbs, eye drops, creams, and over-the-counter medicines. Any problems you or family members have had with anesthetic medicines. Any bleeding problems you have. Any surgeries you have had. Any medical conditions you have. Whether you are pregnant or may be pregnant. What are the risks? Generally, this is a safe procedure. However, problems may occur, including: Infection. Bleeding. Scarring. Allergic reactions to medicines. Damage to nearby structures or organs, such as damage to nerves or blood vessels near the lipoma. What happens before the procedure? When to Stop Eating and Drinking Follow instructions from your health care provider about what you may eat and drink before your procedure. These may include: 8 hours before your procedure Stop eating most foods. Do not eat meat, fried foods, or fatty foods. Eat only light foods, such as toast or crackers. All liquids are okay except energy drinks and alcohol. 6 hours before your procedure Stop eating. Drink only clear liquids, such as water, clear fruit juice, black coffee, plain tea, and sports drinks. Do not drink energy drinks or alcohol. 2 hours before your procedure Stop drinking all liquids. You may be allowed to take  medicines with small sips of water. If you do not follow your health care provider's instructions, your procedure may be delayed or canceled. Medicines Ask your health care provider about: Changing or stopping your regular medicines. This is especially important if you are taking diabetes medicines or blood thinners. Taking medicines such as aspirin and ibuprofen. These medicines can thin your blood. Do not take these medicines unless your health care provider tells you to take them. Taking over-the-counter medicines, vitamins, herbs, and supplements. General instructions You will have a physical exam. Your health care provider will check the size of the lipoma and whether it can be removed easily. You may have a biopsy and imaging tests, such as X-rays, a CT scan, and an MRI. Do not use any products that contain nicotine or tobacco for at least 4 weeks before the procedure. These products include cigarettes, chewing tobacco, and vaping devices, such as e-cigarettes. If you need help quitting, ask your health care provider. Ask your health care provider: How your surgery site will be marked. What steps will be taken to help prevent infection. These may include: Washing skin with a germ-killing soap. Taking antibiotic medicine. If you will be going home right after the procedure, plan to have a responsible adult: Take you home from the hospital or clinic. You will not be allowed to drive. Care for you for the time you are told. What happens during the procedure?  An IV will be inserted into one of your veins. You will be given one or more of the following: A medicine to help you relax (sedative). A medicine to numb  the area (local anesthetic). A medicine to make you fall asleep (general anesthetic). A medicine that is injected into an area of your body to numb everything below the injection site (regional anesthetic). An incision will be made into the skin over the lipoma or very near the  lipoma. The incision may be made in a natural skin line or crease. Tissues, nerves, and blood vessels near the lipoma will be moved out of the way. The lipoma and the capsule that surrounds it will be separated from the surrounding tissues. The lipoma will be removed. The incision may be closed with stitches (sutures). A bandage (dressing) will be placed over the incision. The procedure may vary among health care providers and hospitals. What happens after the procedure? Your blood pressure, heart rate, breathing rate, and blood oxygen level will be monitored until you leave the hospital or clinic. If you were prescribed an antibiotic medicine, use it as told by your health care provider. Do not stop using the antibiotic even if you start to feel better. If you were given a sedative during the procedure, it can affect you for several hours. Do not drive or operate machinery until your health care provider says that it is safe. Where to find more information OrthoInfo: orthoinfo.aaos.org Summary Before the procedure, follow instructions from your health care provider about eating and drinking, and changing or stopping your regular medicines. This is especially important if you are taking diabetes medicines or blood thinners. After the lipoma is removed, the incision may be closed with stitches (sutures) and covered with a bandage (dressing). If you were given a sedative during the procedure, it can affect you for several hours. Do not drive or operate machinery until your health care provider says that it is safe. This information is not intended to replace advice given to you by your health care provider. Make sure you discuss any questions you have with your health care provider. Document Revised: 06/06/2021 Document Reviewed: 06/06/2021 Elsevier Patient Education  [employer].

## 2022-07-24 NOTE — Progress Notes (Signed)
  Procedure Date:  07/24/2022  Pre-operative Diagnosis:  Left lower back lipoma  Post-operative Diagnosis:  Left lower back lipoma, aggregate size 4 cm  Procedure:  Excision of left lower back lipoma  Surgeon:  Melvyn Neth, MD  Anesthesia:  10 ml of 1% lidocaine with epi  Estimated Blood Loss:  10 ml  Specimens:  left lower back lipoma  Complications:  None  Indications for Procedure:  This is a 82 y.o. female with diagnosis of a symptomatic left lower back lipoma.  The patient wishes to have this excised. The risks of bleeding, abscess or infection, injury to surrounding structures, and need for further procedures were all discussed with the patient and she was willing to proceed.  Description of Procedure: The patient was correctly identified at bedside.  The patient was placed supine.  Appropriate time-outs were performed.  The patient's left lower back was prepped and draped in usual sterile fashion.  Local anesthetic was infused intradermally.  A 4 cm incision was made over the lipoma, and scalpel was used to dissect down the skin and subcutaneous tissue.  Skin flaps were created sharply, and then the lipoma was excised.  It was sent off to pathology.  The cavity was then irrigated and hemostasis was assured with manual pressure.  The wound was then closed in three layers using 3-0 Vicryl and 4-0 Monocryl.  The incision was cleaned and sealed with DermaBond.  The patient tolerated the procedure well and all sharps were appropriately disposed of at the end of the case.  --Patient may shower tomorrow --May take tylenol or ibuprofen for pain --Activity restrictions discussed --Follow up in 10 days.   Melvyn Neth, [facility]

## 2022-07-27 ENCOUNTER — Ambulatory Visit: Payer: PPO | Admitting: Surgery

## 2022-08-03 ENCOUNTER — Other Ambulatory Visit: Payer: Self-pay

## 2022-08-03 ENCOUNTER — Ambulatory Visit (INDEPENDENT_AMBULATORY_CARE_PROVIDER_SITE_OTHER): Payer: PPO | Admitting: Surgery

## 2022-08-03 ENCOUNTER — Encounter: Payer: Self-pay | Admitting: Surgery

## 2022-08-03 VITALS — BP 146/84 | HR 80 | Temp 98.2°F | Ht 64.0 in | Wt 165.0 lb

## 2022-08-03 DIAGNOSIS — Z09 Encounter for follow-up examination after completed treatment for conditions other than malignant neoplasm: Secondary | ICD-10-CM

## 2022-08-03 DIAGNOSIS — D171 Benign lipomatous neoplasm of skin and subcutaneous tissue of trunk: Secondary | ICD-10-CM

## 2022-08-03 NOTE — Progress Notes (Signed)
08/03/2022  HPI: Annemarie Ozaeta is a 82 y.o. female s/p excision of left lower back lipoma on 07/24/22.  She presents for follow up.  She reports she has been doing well and denies any pain issues in the area.  Dermabond has peeled off already.  Denies any troubles with the incision, drainage, or other concerns.  Vital signs: BP (!) 146/84   Pulse 80   Temp 98.2 F (36.8 C) (Oral)   Ht '5\' 4"'$  (1.626 m)   Wt 165 lb (74.8 kg)   SpO2 98%   BMI 28.32 kg/m    Physical Exam: Constitutional:  No acute distress Skin:  Left lower back incision is healing well and is clean, dry, intact, with some mild ecchymosis inferior to the incision.  Dermabond is off, without any wound dehiscence.  Assessment/Plan: This is a 82 y.o. female s/p excision of left lower back lipoma.  --Patient is healing well, without complications.  Reviewed pathology results showing this to be a lipoma.  No evidence of malignancy. --OK to bathe or submerge the wound. --Follow up as needed.   Melvyn Neth, [facility]

## 2022-08-03 NOTE — Patient Instructions (Signed)
Please call with any questions or concerns.

## 2024-06-17 ENCOUNTER — Encounter: Payer: Self-pay | Admitting: Emergency Medicine

## 2024-06-17 ENCOUNTER — Ambulatory Visit
Admission: EM | Admit: 2024-06-17 | Discharge: 2024-06-17 | Disposition: A | Discharge status: Home / Self Care | Attending: Family Medicine | Admitting: Family Medicine

## 2024-06-17 DIAGNOSIS — S0993XA Unspecified injury of face, initial encounter: Secondary | ICD-10-CM | POA: Diagnosis not present

## 2024-06-17 DIAGNOSIS — W19XXXA Unspecified fall, initial encounter: Secondary | ICD-10-CM | POA: Diagnosis not present

## 2024-06-17 NOTE — Discharge Instructions (Signed)
 Rest. Tylenol  as needed.  If anything worsens (worsening headache, speech changes, altered mentation, etc.), please call [email or web address].SABRA Ores and vaseline to the lip

## 2024-06-17 NOTE — ED Triage Notes (Signed)
 Patient states that she fell in the parking lot and hit her lip on the pavement.  Patient states that she tripped on the uneven pavement.  Patient denies LOC.  Patient reports throbbing headache.  Patient unsure if she hit her head.  Patient reports pain on the left side of her face and her lip area.

## 2024-06-17 NOTE — ED Provider Notes (Signed)
 " [facility]    CSN: [id number] Arrival date & time: 06/17/24  1350      History   Chief Complaint Chief Complaint  Patient presents with   Fall   Facial Injury   HPI  84 year old female presents for evaluation of the above.  Patient was at a [employer] and tripped and fell at about 130.  No loss of consciousness.  She reports headache.  She has a busted left upper lip.  Unsure of head injury.  There are no hematomas present.  She complains of some trapezius discomfort.  No reported weakness or vision difficulty.  She came directly in for evaluation.  No other reported symptoms.  No other complaints.  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   Facial droop 11/22/2014   CMC arthritis, thumb, 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   History of GI bleed 02/27/2011    Past Surgical History:  Procedure Laterality Date   ABDOMINAL HYSTERECTOMY  1994   BREAST BIOPSY Left 08/31/1971   neg   BREAST LUMPECTOMY Right    CATARACT EXTRACTION W/PHACO Right 03/09/2022   Procedure: CATARACT EXTRACTION PHACO AND INTRAOCULAR LENS PLACEMENT (IOC) RIGHT;  Surgeon: Myrna Adine Anes, MD;  Location: [facility];  Service: Ophthalmology;  Laterality: Right;  5.01 00:36.2   CATARACT EXTRACTION W/PHACO Left 03/23/2022   Procedure: CATARACT EXTRACTION PHACO AND INTRAOCULAR LENS PLACEMENT (IOC) LEFT;  Surgeon:  Myrna Adine Anes, MD;  Location: [facility];  Service: Ophthalmology;  Laterality: Left;  10.47 0:56.1   COLONOSCOPY W/ BIOPSIES  09/2015   TONSILLECTOMY  1950   TOTAL KNEE ARTHROPLASTY Bilateral 2016    OB History   No obstetric history on file.      Home Medications    Prior to Admission medications  Medication Sig Start Date End Date Taking? Authorizing Provider  acetaminophen  (TYLENOL ) 500 MG tablet Take 2 tablets (1,000 mg total) by mouth 2 (two) times daily. 08/12/16   Plonk, Elsie, MD  Ascorbic Acid (VITAMIN C) 1000 MG tablet Take 1,000 mg by mouth daily.    [provider]  Biotin 5000 MCG TABS Take 2,000 mcg by mouth.     [provider]  Calcium-Magnesium 500-250 MG TABS Take by mouth.    [provider]  celecoxib (CELEBREX) 100 MG capsule Take 100 mg by mouth daily as needed.    [provider]  escitalopram (LEXAPRO) 10 MG tablet Take 10 mg by mouth daily.    [provider]  letrozole (FEMARA) 2.5 MG tablet Take 2.5 mg by mouth daily.    [provider]  Multiple Vitamin (MULTIVITAMIN) tablet Take 1 tablet by mouth daily.    [provider]  VITAMIN D , CHOLECALCIFEROL, PO Take 1,000 Units by mouth daily.    [provider]    Family History Family History  Problem Relation Age of Onset   Breast cancer  Maternal Aunt 75   CAD Mother    Stroke Mother    Prostate cancer Father    Thyroid  cancer Daughter     Social History Social History[1]   Allergies   Patient has no known allergies.   Review of Systems Review of Systems Per HPI  Physical Exam Triage Vital Signs ED Triage Vitals  Encounter Vitals Group     BP 06/17/24 1420 (!) 141/75     Girls Systolic BP Percentile --      Girls Diastolic BP Percentile --      Boys Systolic BP Percentile --      Boys Diastolic BP Percentile --      Pulse Rate 06/17/24 1420 71     Resp 06/17/24 1420 15     Temp 06/17/24 1420 98.5  F (36.9 C)     Temp Source 06/17/24 1420 Oral     SpO2 06/17/24 1420 99 %     Weight 06/17/24 1419 164 lb 14.5 oz (74.8 kg)     Height 06/17/24 1419 5' 4 (1.626 m)     Head Circumference --      Peak Flow --      Pain Score 06/17/24 1419 8     Pain Loc --      Pain Education --      Exclude from Growth Chart --    No data found.  Updated Vital Signs BP (!) 141/75 (BP Location: Left Arm)   Pulse 71   Temp 98.5 F (36.9 C) (Oral)   Resp 15   Ht 5' 4 (1.626 m)   Wt 74.8 kg   SpO2 99%   BMI 28.31 kg/m   Visual Acuity Right Eye Distance:   Left Eye Distance:   Bilateral Distance:    Right Eye Near:   Left Eye Near:    Bilateral Near:     Physical Exam Vitals and nursing note reviewed.  Constitutional:      General: She is not in acute distress. HENT:     Head:      Comments: Swelling and abrasion noted to the left upper lip.  There are no hematomas present.  No scalp step-offs. She has mild tenderness over the left maxilla.    Nose: Nose normal.     Mouth/Throat:     Comments: No dental injury. Eyes:     Extraocular Movements: Extraocular movements intact.     Conjunctiva/sclera: Conjunctivae normal.     Pupils: Pupils are equal, round, and reactive to light.  Neck:     Comments: No tenderness of the cervical spine. Musculoskeletal:     Cervical back: No rigidity.  Neurological:     General: No focal deficit present.     Mental Status: She is alert.     Sensory: No sensory deficit.     Motor: No weakness.     Gait: Gait normal.  Psychiatric:        Mood and Affect: Mood normal.        Behavior: Behavior normal.      UC Treatments / Results  Labs (all labs ordered are listed, but only abnormal results are displayed) Labs Reviewed - No data to display  EKG   Radiology No results found.  Procedures Procedures (including critical care time)  Medications Ordered in UC Medications - No data to display  Initial Impression / Assessment and  Plan / UC Course  I have reviewed the triage vital signs and the nursing notes.  Pertinent labs & imaging results that were available during my care of the patient were reviewed by me and considered in my medical decision making (see chart for details).    84 year old female presents for evaluation after suffering a fall.  She is not on chronic anticoagulation.  There are no scalp step-offs or hematomas to suggest head injury.  She does have an abrasion to the left upper lip.  No dental injury.  No cervical spine tenderness.  Discussed going to the ER versus watchful waiting at home.  After lengthy discussion with the patient as well as family numbers, they elected to go home and monitor her closely.  Tylenol  as needed.  Rest and supportive care.   Final Clinical Impressions(s) / UC Diagnoses   Final diagnoses:  Fall, initial encounter  Injury of lip, initial encounter     Discharge Instructions      Rest. Tylenol  as needed.  If anything worsens (worsening headache, speech changes, altered mentation, etc.), please call 911.SABRA Ores and vaseline to the lip   ED Prescriptions   None    PDMP not reviewed this encounter.    [1]  Social History Tobacco Use   Smoking status: Never    Passive exposure: Past   Smokeless tobacco: Never  Vaping Use   Vaping status: Never Used  Substance Use Topics   Alcohol use: No   Drug use: No     Mishael Haran G, DO 06/17/24 1511  "
